# Patient Record
Sex: Female | Born: 1965 | Race: Black or African American | Hispanic: No | Smoking: Never smoker
Health system: Southern US, Community
[De-identification: ages and names within clinical notes are randomized; demographics above are authoritative.]

## PROBLEM LIST (undated history)

## (undated) DIAGNOSIS — E785 Hyperlipidemia, unspecified: Secondary | ICD-10-CM

## (undated) DIAGNOSIS — M549 Dorsalgia, unspecified: Secondary | ICD-10-CM

## (undated) DIAGNOSIS — D649 Anemia, unspecified: Secondary | ICD-10-CM

## (undated) DIAGNOSIS — E669 Obesity, unspecified: Secondary | ICD-10-CM

## (undated) DIAGNOSIS — M1990 Unspecified osteoarthritis, unspecified site: Secondary | ICD-10-CM

## (undated) DIAGNOSIS — M2550 Pain in unspecified joint: Secondary | ICD-10-CM

## (undated) DIAGNOSIS — Z86010 Personal history of colonic polyps: Secondary | ICD-10-CM

## (undated) DIAGNOSIS — K5900 Constipation, unspecified: Secondary | ICD-10-CM

## (undated) DIAGNOSIS — H409 Unspecified glaucoma: Secondary | ICD-10-CM

## (undated) DIAGNOSIS — H269 Unspecified cataract: Secondary | ICD-10-CM

## (undated) DIAGNOSIS — Z91018 Allergy to other foods: Secondary | ICD-10-CM

## (undated) DIAGNOSIS — M7989 Other specified soft tissue disorders: Secondary | ICD-10-CM

## (undated) DIAGNOSIS — E559 Vitamin D deficiency, unspecified: Secondary | ICD-10-CM

## (undated) HISTORY — DX: Personal history of colonic polyps: Z86.010

## (undated) HISTORY — PX: EYE SURGERY: SHX253

## (undated) HISTORY — PX: VARICOSE VEIN SURGERY: SHX832

## (undated) HISTORY — DX: Pain in unspecified joint: M25.50

## (undated) HISTORY — DX: Anemia, unspecified: D64.9

## (undated) HISTORY — DX: Unspecified osteoarthritis, unspecified site: M19.90

## (undated) HISTORY — DX: Constipation, unspecified: K59.00

## (undated) HISTORY — DX: Hyperlipidemia, unspecified: E78.5

## (undated) HISTORY — DX: Allergy to other foods: Z91.018

## (undated) HISTORY — DX: Unspecified cataract: H26.9

## (undated) HISTORY — DX: Unspecified glaucoma: H40.9

## (undated) HISTORY — DX: Other specified soft tissue disorders: M79.89

## (undated) HISTORY — DX: Vitamin D deficiency, unspecified: E55.9

## (undated) HISTORY — DX: Obesity, unspecified: E66.9

## (undated) HISTORY — DX: Dorsalgia, unspecified: M54.9

---

## 1998-12-18 ENCOUNTER — Encounter: Admission: RE | Admit: 1998-12-18 | Discharge: 1999-03-18 | Payer: Self-pay | Admitting: Emergency Medicine

## 2004-01-20 ENCOUNTER — Other Ambulatory Visit: Admission: RE | Admit: 2004-01-20 | Discharge: 2004-01-20 | Payer: Self-pay | Admitting: Family Medicine

## 2005-01-04 ENCOUNTER — Other Ambulatory Visit: Admission: RE | Admit: 2005-01-04 | Discharge: 2005-01-04 | Payer: Self-pay | Admitting: Family Medicine

## 2005-03-04 HISTORY — PX: TUBAL LIGATION: SHX77

## 2006-01-28 ENCOUNTER — Other Ambulatory Visit: Admission: RE | Admit: 2006-01-28 | Discharge: 2006-01-28 | Payer: Self-pay | Admitting: Family Medicine

## 2006-09-12 ENCOUNTER — Encounter (INDEPENDENT_AMBULATORY_CARE_PROVIDER_SITE_OTHER): Payer: Self-pay | Admitting: *Deleted

## 2007-03-05 HISTORY — PX: ABDOMINOPLASTY: SUR9

## 2007-07-22 ENCOUNTER — Other Ambulatory Visit: Admission: RE | Admit: 2007-07-22 | Discharge: 2007-07-22 | Payer: Self-pay | Admitting: Gynecology

## 2007-11-06 ENCOUNTER — Encounter: Admission: RE | Admit: 2007-11-06 | Discharge: 2007-11-06 | Payer: Self-pay | Admitting: Family Medicine

## 2008-02-13 ENCOUNTER — Emergency Department (HOSPITAL_COMMUNITY): Admission: EM | Admit: 2008-02-13 | Discharge: 2008-02-13 | Payer: Self-pay | Admitting: Emergency Medicine

## 2008-03-05 ENCOUNTER — Observation Stay (HOSPITAL_COMMUNITY): Admission: EM | Admit: 2008-03-05 | Discharge: 2008-03-06 | Payer: Self-pay | Admitting: Emergency Medicine

## 2010-02-08 ENCOUNTER — Emergency Department (HOSPITAL_COMMUNITY): Admission: EM | Admit: 2010-02-08 | Discharge: 2010-01-15 | Payer: Self-pay | Admitting: Emergency Medicine

## 2010-06-18 LAB — CROSSMATCH
ABO/RH(D): O POS
Antibody Screen: NEGATIVE

## 2010-06-18 LAB — CBC
Platelets: 323 10*3/uL (ref 150–400)
RDW: 15.4 % (ref 11.5–15.5)
WBC: 8.3 10*3/uL (ref 4.0–10.5)

## 2010-06-18 LAB — DIFFERENTIAL
Basophils Absolute: 0 10*3/uL (ref 0.0–0.1)
Lymphocytes Relative: 13 % (ref 12–46)
Lymphs Abs: 1.1 10*3/uL (ref 0.7–4.0)
Neutro Abs: 6.3 10*3/uL (ref 1.7–7.7)
Neutrophils Relative %: 76 % (ref 43–77)

## 2010-06-18 LAB — URINALYSIS, ROUTINE W REFLEX MICROSCOPIC
Glucose, UA: NEGATIVE mg/dL
Ketones, ur: NEGATIVE mg/dL
Protein, ur: NEGATIVE mg/dL
Urobilinogen, UA: 0.2 mg/dL (ref 0.0–1.0)

## 2010-06-18 LAB — ABO/RH: ABO/RH(D): O POS

## 2010-07-17 NOTE — Op Note (Signed)
NAME:  Brenda Gonzales, Brenda Gonzales                ACCOUNT NO.:  [id number]   MEDICAL RECORD NO.:  [id number]          PATIENT TYPE:  INP   LOCATION:  5125                         FACILITY:  [facility]   PHYSICIAN:  Consuello Bossier., M.D.DATE OF BIRTH:  [birth year]   DATE OF PROCEDURE:  DATE OF DISCHARGE:                               OPERATIVE REPORT   PREOPERATIVE DIAGNOSIS:  Hematoma, following previous abdominoplasty.   POSTOPERATIVE DIAGNOSIS:  Hematoma following previous abdominoplasty.   OPERATION PERFORMED:  Exploration of abdominoplasty wound with placement  of drains following evacuation of hematoma.   SURGEON:  Pleas Patricia, M.D.   ASSISTANT:  Etter Sjogren, M.D.   ANESTHESIA:  General endotracheal.   FINDINGS:  The patient over 4 week ago had an abdominoplasty and had  drains which did not appear to be functioning well, but it had been  removed after approximately 3 weeks.  She had noticed an increased  discomfort and swelling of her abdomen and appeared to have a hematoma  which I feel would be best treated by exploration and the above surgical  procedure was carried out.  At surgery, there was found to be an old  liquefied hematoma and no active bleeding and the hematoma was drained  and some chronic granulation tissue was removed.  No definite active  bleeding site was found and two 10-mm Blake drains were placed in the  area and the wound closed primarily.   PROCEDURE:  The patient was brought to the operating room, given general  endotracheal anesthetic, prepped with Betadine and draped the abdomen in  sterile fashion.  A previous central aspect with transverse lower  abdomen incision was incised and a liquefied hematoma was encountered  and it was seemed to be under pressure, and it was removed with suction.  Suction and digital exploration was then used to explore what appeared  to be the cavity that the hematoma existent and there was no active  bleeding noted  throughout.  It did course more on the left side  superiorly and laterally.  The wound was irrigated with copious amount  of normal saline, again no active bleeding was encountered.  There was  some granulation tissue that was removed.  Wound edges were treated with  electrocautery.  Two 10-mm flat Blake drains were placed in the cavity  and brought up to suprapubic stab wounds.  Following this, the wound was  closed interrupted subcutaneous #3-0 Monocryl followed by running  subcuticular #4-0 and 3-0 Monocryl.  Steri-Strips, Xeroform followed by  abdominal binder placed with slight-to-moderate compression was applied.  The patient tolerated the procedure well, was able to be discharged from  operating room to the recovery room in satisfactory condition.      Consuello Bossier., M.D.    HH/MEDQ  D:  03/05/2008  T:  03/06/2008  Job:  [id number]

## 2010-07-17 NOTE — Discharge Summary (Signed)
NAME:  Brenda Gonzales, Brenda Gonzales                ACCOUNT NO.:  [id number]   MEDICAL RECORD NO.:  [id number]          PATIENT TYPE:  INP   LOCATION:  5125                         FACILITY:  [facility]   PHYSICIAN:  Consuello Bossier., M.D.DATE OF BIRTH:  10/23/1965   DATE OF ADMISSION:  03/05/2008  DATE OF DISCHARGE:  03/06/2008                               DISCHARGE SUMMARY   FINAL DIAGNOSES:  Hematoma following previous abdominoplasty.   OPERATION PERFORMED:  Evacuation of hematoma with placement of drains on  March 05, 2008.   PRESENT ILLNESS:  This 45 year old female was admitted with a history of  having undergone abdominoplasty on February 09, 2008.  Her drains have  not drained a great deal but it appear that until the day prior to  surgery that any degree of swelling she had may be absorbed.  However,  in the morning of admission to the hospital, she developed increasing  discomfort and swelling in the suprapubic area and what appeared to be  possibly an enlarging hematoma and it was felt best to admit the patient  to hospital and in the operating room under anesthesia explore the wound  and drain it.  At surgery, she was found to have an old liquefied  hematoma which was evacuated, irrigated and drains in place.   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   REVIEW OF SYSTEMS:  Normal limits.   PHYSICAL EXAMINATION:  GENERAL:  Some periumbilical bruising and what  appear to be some increase in swelling near the lower abdominal incision  and somewhat left to the midline.  There was also some suprapubic  swelling.   HOSPITAL COURSE:  As noted above.  The patient was taken to the  operating room and the liquefied hematoma was drained and drains placed.  The following morning, she was doing well taking minimal pain  medications and will be discharged  without any pain prescription on  Keflex 250 mg p.o. q.i.d. for 6 days.  2 Blake drains were in place  which she will empty and record as an outpatient and I will see her in 3  days for followup or before if any problems.  She has been quite active  since surgery and will continue that level of activity with main  difference being having drains in place which will be removed as soon as  the drainage is down to relatively minimal amount.      Consuello Bossier., M.D.  Electronically Signed     HH/MEDQ  D:  03/06/2008  T:  03/07/2008  Job:  [id number]

## 2010-07-17 NOTE — H&P (Signed)
NAME:  Brenda Gonzales, Brenda Gonzales                ACCOUNT NO.:  [id number]   MEDICAL RECORD NO.:  [id number]          PATIENT TYPE:  INP   LOCATION:  5125                         FACILITY:  [facility]   PHYSICIAN:  Consuello Bossier., M.D.DATE OF BIRTH:  07/18/1965   DATE OF ADMISSION:  03/05/2008  DATE OF DISCHARGE:                              HISTORY & PHYSICAL   HISTORY OF PRESENT ILLNESS:  This is a 45 year old female, is admitted  for evacuation of a hematoma of her abdomen.  The patient underwent an  abdominoplasty on the February 09, 2008, and the drains did not function  very well.  She has had the drains removed and has noted since the drain  removal, an increasing discomfort of her lower abdomen and it appears  that she has hematoma which I think would be best evacuated and  hemostasis achieved and drains were placed.  She has been apprised of  the operation as well as the reasons behind it.  Of note, the patient is  legally blind.  Dr. Lelon Perla has done an eye operation on her right eye  since her abdominoplasty for some fluid collection and she takes eye  drops for that.   PAST MEDICAL HISTORY:  The patient has lost a great deal of weight, and  that was one of the main reason that she chose to proceed with an  abdominoplasty.  She takes Zocor, but takes no other medications and has  no long term, otherwise medical problems other than her blindness.   REVIEW OF SYSTEMS:  HEENT:  Negative.  PULMONARY:  Negative.  GI:  Negative.  GU:  Negative.   PHYSICAL EXAMINATION:  GENERAL:  The patient is cooperative 45 year old  female in no distress.  She does not appear to have great deal of lack  of conjunctival redness and pulse rate is 84 and regular.  She is  congenitally blind but can see forms apparently.  CHEST:  Clear to auscultation.  HEART:  Normal sinus rhythm, no murmur.  ABDOMEN:  She has previous abdominoplasty incision and a slight amount  of drainage in the central area on two  drain sites inferior to that and  appears in size I last saw her to have an increased accumulation of  fluid particularly in the lower abdomen near the incision and suprapubic  area.  There are some ecchymosis, but not a great deal.  She has quite a  bit of laxity of her arms and legs from her previous significant weight  loss.   IMPRESSION:  Postoperative hematoma from abdominoplasty performed on  February 09, 2008.   DISPOSITION:  As noted above, the patient is to have the wound opened up  and explored with evacuation of the hematoma, hemostasis, and placement  of drains.  She has been apprised of the operation as well as the risk  and, agrees to proceed.      Consuello Bossier., M.D.     HH/MEDQ  D:  03/05/2008  T:  03/06/2008  Job:  [id number]

## 2011-12-07 ENCOUNTER — Ambulatory Visit (INDEPENDENT_AMBULATORY_CARE_PROVIDER_SITE_OTHER): Payer: BC Managed Care – PPO | Admitting: Physician Assistant

## 2011-12-07 VITALS — BP 110/72 | HR 64 | Temp 98.0°F | Resp 16 | Ht 66.18 in | Wt 184.8 lb

## 2011-12-07 DIAGNOSIS — J329 Chronic sinusitis, unspecified: Secondary | ICD-10-CM

## 2011-12-07 DIAGNOSIS — R51 Headache: Secondary | ICD-10-CM

## 2011-12-07 MED ORDER — AMOXICILLIN 875 MG PO TABS: 875.0000 mg | ORAL_TABLET | Freq: Two times a day (BID) | ORAL | Status: DC

## 2011-12-07 MED ORDER — FLUTICASONE PROPIONATE 50 MCG/ACT NA SUSP: 2.0000 | Freq: Every day | NASAL | Status: DC

## 2011-12-07 NOTE — Progress Notes (Signed)
  Subjective:    Patient ID: Brenda Gonzales, female    DOB: 1966/01/25, 46 y.o.   MRN: [id number]  HPI 46 year old female presents with acute onset of sinus pain/pressure, nasal congestion, postnasal drip, headache, and right sided otalgia and pressure.  Denies fever, chills, nausea, vomiting, or abdominal pain.  She has not taken any medications yet due to "not liking to take medicines." otherwise healthy person except for hyperlipidemia.      Review of Systems  Constitutional: Negative for fever and chills.  HENT: Positive for ear pain (right sided), congestion, sneezing, postnasal drip and sinus pressure. Negative for sore throat.   Respiratory: Negative for cough and wheezing.   Gastrointestinal: Negative for nausea, vomiting and abdominal pain.  Neurological: Positive for headaches (frontal).  All other systems reviewed and are negative.       Objective:   Physical Exam  Constitutional: She is oriented to person, place, and time. She appears well-developed and well-nourished.  HENT:  Head: Normocephalic and atraumatic.  Right Ear: Hearing and external ear normal. No drainage or swelling. No mastoid tenderness. Tympanic membrane is erythematous. Tympanic membrane is not perforated. A middle ear effusion is present.  Left Ear: Hearing, tympanic membrane, external ear and ear canal normal.  Mouth/Throat: Uvula is midline, oropharynx is clear and moist and mucous membranes are normal. No oropharyngeal exudate.  Eyes: Conjunctivae normal are normal.  Neck: Normal range of motion.  Cardiovascular: Normal rate, regular rhythm and normal heart sounds.   Pulmonary/Chest: Effort normal and breath sounds normal.  Lymphadenopathy:    She has no cervical adenopathy.  Neurological: She is alert and oriented to person, place, and time.  Psychiatric: She has a normal mood and affect. Her behavior is normal. Judgment and thought content normal.          Assessment & Plan:   1. Sinusitis   amoxicillin (AMOXIL) 875 MG tablet, DISCONTINUED: fluticasone (FLONASE) 50 MCG/ACT nasal spray   Flonase cancelled at pharmacy Start amoxicillin 875 mg bid  Recommend Mucinex OTC to help with congestion Motrin or tylenol prn headache.  Follow up if symptoms worsen or fail to improve.

## 2011-12-07 NOTE — Patient Instructions (Signed)
Mucinex as directed to help with congestion Use Flonase 2 sprays in each nostril daily

## 2014-08-16 ENCOUNTER — Ambulatory Visit (INDEPENDENT_AMBULATORY_CARE_PROVIDER_SITE_OTHER): Payer: BLUE CROSS/BLUE SHIELD | Admitting: Family Medicine

## 2014-08-16 VITALS — BP 122/80 | HR 69 | Temp 98.9°F | Resp 18 | Ht 66.0 in | Wt 209.2 lb

## 2014-08-16 DIAGNOSIS — S39012A Strain of muscle, fascia and tendon of lower back, initial encounter: Secondary | ICD-10-CM

## 2014-08-16 MED ORDER — CYCLOBENZAPRINE HCL 5 MG PO TABS: 5.0000 mg | ORAL_TABLET | Freq: Three times a day (TID) | ORAL | Status: DC | PRN

## 2014-08-16 MED ORDER — MELOXICAM 7.5 MG PO TABS: 7.5000 mg | ORAL_TABLET | Freq: Every day | ORAL | Status: DC

## 2014-08-16 NOTE — Patient Instructions (Signed)
Gently stretch the muscles of the back and continue the cold and hot packs.

## 2014-08-16 NOTE — Progress Notes (Signed)
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 She woke up yesterday morning and bent over to pick up a towel at about 5 AM and felt a sudden spasm in her right low back. She tried to go to work but had come home in tears. Since that time her back as gotten better but she still has enough pain that started her to bend over and move about.  Patient has not had any fever or urinary symptoms. She's had no nausea or vomiting. She has no pain radiating down her leg. Instead, the pain is located in the right posterior superior iliac crest region. She's had no rash.  Objective: Patient appears to be in her usual state of health. 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 She has no rash. She has good strength in her gait is stable.  Assessment: Simple muscle strain which should resolve in the next 48 hours. She's already getting better so I think we'll just keep her out of work for another day and let her go back on Thursday. This chart was scribed in my presence and reviewed by me personally.    ICD-9-CM ICD-10-CM   1. Back strain, initial encounter 847.9 S39.012A cyclobenzaprine (FLEXERIL) 5 MG tablet     meloxicam (MOBIC) 7.5 MG tablet     Signed, Robyn Haber, MD

## 2015-03-18 ENCOUNTER — Ambulatory Visit (INDEPENDENT_AMBULATORY_CARE_PROVIDER_SITE_OTHER): Payer: BLUE CROSS/BLUE SHIELD | Admitting: Family Medicine

## 2015-03-18 VITALS — BP 112/68 | HR 76 | Temp 98.0°F | Resp 18 | Ht 66.0 in | Wt 216.0 lb

## 2015-03-18 DIAGNOSIS — J0100 Acute maxillary sinusitis, unspecified: Secondary | ICD-10-CM

## 2015-03-18 MED ORDER — AMOXICILLIN-POT CLAVULANATE 875-125 MG PO TABS: 1.0000 | ORAL_TABLET | Freq: Two times a day (BID) | ORAL | Status: DC

## 2015-03-18 NOTE — Progress Notes (Deleted)
   Subjective:    Patient ID: Brenda Gonzales, female    DOB: 11-Jun-1965, 50 y.o.   MRN: [id number]  HPI    Review of Systems     Objective:   Physical Exam        Assessment & Plan:

## 2015-03-18 NOTE — Patient Instructions (Signed)

## 2015-03-18 NOTE — Progress Notes (Signed)
This chart was scribed for Brenda Haber, MD by [facility], medical scribe at [facility].The patient was seen in exam room 13 and the patient's care was started at 9:38 AM.  Patient ID: Brenda Gonzales MRN: [id number], DOB: 11-Mar-1965, 50 y.o. Date of Encounter: 03/18/2015  Primary Physician: Vicenta Aly, [place]  Chief Complaint:  Chief Complaint  Patient presents with   Cough    over a mth mth    Nasal Congestion    blood mucous now    Headache   HPI:  Brenda Gonzales is a 50 y.o. female who presents to [facility] due to a cough, congestion for one month. Lately she has been having sinus pressure and headaches. Now producing a bloody nasal discharge. Hx of congential nystagmus, she lives alone and must take a uber to work. Also uses [employer]. Works for [employer] for [employer].  Past Medical History  Diagnosis Date   Hyperlipidemia    Cataract     Home Meds: Prior to Admission medications   Medication Sig Start Date End Date Taking? Authorizing Provider  Brimonidine Tartrate-Timolol (COMBIGAN OP) Apply 1 drop to eye 2 (two) times daily. Right eye only   Yes Historical Provider, MD  Cholecalciferol (VITAMIN D3) 1000 UNITS CAPS Take 1 capsule by mouth daily.   Yes Historical Provider, MD  latanoprost (XALATAN) 0.005 % ophthalmic solution Place 1 drop into the right eye at bedtime.   Yes Historical Provider, MD  simvastatin (ZOCOR) 20 MG tablet Take 20 mg by mouth daily.   Yes Historical Provider, MD  amoxicillin (AMOXIL) 875 MG tablet Take 1 tablet (875 mg total) by mouth 2 (two) times daily. Patient not taking: Reported on 08/16/2014 12/07/11   Collene Leyden, PA-C  atorvastatin (LIPITOR) 20 MG tablet Take 20 mg by mouth daily. Reported on 03/18/2015    Historical Provider, MD  cyclobenzaprine (FLEXERIL) 5 MG tablet Take 1 tablet (5 mg total) by mouth 3 (three) times daily as needed for muscle spasms. Patient not taking: Reported on  03/18/2015 08/16/14   Brenda Haber, MD  meloxicam (MOBIC) 7.5 MG tablet Take 1 tablet (7.5 mg total) by mouth daily. Patient not taking: Reported on 03/18/2015 08/16/14   Brenda Haber, MD   Allergies:  Allergies  Allergen Reactions   Benzoyl Peroxide Swelling   Social History   Social History   Marital Status: Divorced    Spouse Name: N/A   Number of Children: N/A   Years of Education: N/A   Occupational History   Not on file.   Social History Main Topics   Smoking status: Never Smoker    Smokeless tobacco: Not on file   Alcohol Use: Not on file   Drug Use: Not on file   Sexual Activity: Not on file   Other Topics Concern   Not on file   Social History Narrative    Review of Systems: Constitutional: negative for chills, fever, night sweats, weight changes, or fatigue  HEENT: negative for vision changes, hearing loss ST, epistaxis, or sinus pressure. Positive for sinus pressure, congestion, rhinorrhea. Cardiovascular: negative for chest pain or palpitations Respiratory: negative for hemoptysis, wheezing, shortness of breath, or cough Abdominal: negative for abdominal pain, nausea, vomiting, diarrhea, or constipation Dermatological: negative for rash Neurologic: negative for dizziness, or syncope. Positive for headache. All other systems reviewed and are otherwise negative with the exception to those above and in the HPI.  Physical Exam: Blood pressure 112/68, pulse 76,  temperature 98 F (36.7 C), temperature source Oral, resp. rate 18, height 5\' 6"  (1.676 m), weight 216 lb (97.977 kg), last menstrual period 12/01/2011, SpO2 98 %., Body mass index is 34.88 kg/(m^2). General: Well developed, well nourished, in no acute distress. Head: Normocephalic, atraumatic, eyes without discharge, sclera non-icteric. Bilateral auditory canals clear, TM's are without perforation, pearly grey and translucent with reflective cone of light bilaterally. Oral cavity moist,  posterior pharynx without exudate, erythema, peritonsillar abscess, or post nasal drip. Mild tenderness in her maxillary area, mucosal purulent discharge. Congential nystagmus.  Neck: Supple. No thyromegaly. Full ROM. No lymphadenopathy. Lungs: Clear bilaterally to auscultation without wheezes, rales, or rhonchi. Breathing is unlabored. Heart: RRR with S1 S2. No murmurs, rubs, or gallops appreciated. Abdomen: Soft, non-tender, non-distended with normoactive bowel sounds. No hepatomegaly. No rebound/guarding. No obvious abdominal masses. Msk:  Strength and tone normal for age. Extremities/Skin: Warm and dry. No clubbing or cyanosis. No edema. No rashes or suspicious lesions. Neuro: Alert and oriented X 3. Moves all extremities spontaneously. Gait is normal. CNII-XII grossly in tact. Psych:  Responds to questions appropriately with a normal affect.   Labs:  ASSESSMENT AND PLAN:  50 y.o. year old female with   By signing my name below, I, [name], attest that this documentation has been prepared under the direction and in the presence of Brenda Haber, MD.  Electronically Signed: Lora Havens, medical scribe. 03/18/2015 9:48 AM.    This chart was scribed in my presence and reviewed by me personally.    ICD-9-CM ICD-10-CM   1. Acute maxillary sinusitis, recurrence not specified 461.0 J01.00 amoxicillin-clavulanate (AUGMENTIN) 875-125 MG tablet     Signed, Brenda Haber, MD

## 2015-03-24 ENCOUNTER — Telehealth: Payer: Self-pay

## 2015-03-24 DIAGNOSIS — J0100 Acute maxillary sinusitis, unspecified: Secondary | ICD-10-CM

## 2015-03-24 MED ORDER — FLUTICASONE PROPIONATE 50 MCG/ACT NA SUSP: 2.0000 | Freq: Every day | NASAL | Status: DC

## 2015-03-24 NOTE — Telephone Encounter (Signed)
Pt was prescribed antibiotics from previous visit and  she stated her symptoms are still the same.  Please advise  [phone]

## 2015-03-27 NOTE — Telephone Encounter (Signed)
Left message for pt to call back  °

## 2015-05-10 ENCOUNTER — Encounter (HOSPITAL_COMMUNITY): Payer: Self-pay | Admitting: Emergency Medicine

## 2015-05-10 ENCOUNTER — Emergency Department (INDEPENDENT_AMBULATORY_CARE_PROVIDER_SITE_OTHER): Payer: BLUE CROSS/BLUE SHIELD

## 2015-05-10 ENCOUNTER — Emergency Department (HOSPITAL_COMMUNITY)
Admission: EM | Admit: 2015-05-10 | Discharge: 2015-05-10 | Disposition: A | Discharge status: Home / Self Care | Payer: BLUE CROSS/BLUE SHIELD | Source: Home / Self Care | Attending: Emergency Medicine | Admitting: Emergency Medicine

## 2015-05-10 DIAGNOSIS — R0981 Nasal congestion: Secondary | ICD-10-CM

## 2015-05-10 DIAGNOSIS — R05 Cough: Secondary | ICD-10-CM

## 2015-05-10 DIAGNOSIS — R059 Cough, unspecified: Secondary | ICD-10-CM

## 2015-05-10 MED ORDER — OMEPRAZOLE 40 MG PO CPDR: 40.0000 mg | DELAYED_RELEASE_CAPSULE | Freq: Every day | ORAL | Status: DC

## 2015-05-10 MED ORDER — FLUTICASONE PROPIONATE 50 MCG/ACT NA SUSP: 2.0000 | Freq: Every day | NASAL | Status: DC

## 2015-05-10 MED ORDER — HYDROCODONE-HOMATROPINE 5-1.5 MG/5ML PO SYRP
5.0000 mL | ORAL_SOLUTION | Freq: Four times a day (QID) | ORAL | Status: DC | PRN
Start: 2015-05-10 — End: 2017-06-17

## 2015-05-10 MED ORDER — PREDNISONE 10 MG PO TABS: ORAL_TABLET | ORAL | Status: DC

## 2015-05-10 MED ORDER — CETIRIZINE HCL 10 MG PO TABS: 10.0000 mg | ORAL_TABLET | Freq: Every day | ORAL | Status: DC

## 2015-05-10 NOTE — Discharge Instructions (Signed)
I think your symptoms are coming from silent reflux or chronic inflammation of your sinuses. Take omeprazole daily for 2 weeks to treat any reflux. Use cetirizine and Flonase daily to help with the sinus inflammation. Take prednisone as prescribed. Use the Hycodan as needed for cough. This medicine will make you drowsy. Follow-up with your PCP in about 2 weeks for recheck.

## 2015-05-10 NOTE — ED Provider Notes (Signed)
CSN: [id number]     Arrival date & time 05/10/15  1739 History   First MD Initiated Contact with Patient 05/10/15 1902     Chief Complaint  Patient presents with  . Headache  . Cough   (Consider location/radiation/quality/duration/timing/severity/associated sxs/prior Treatment) HPI Brenda Gonzales is a 50 year old woman here for evaluation of cough. Brenda Gonzales states for the last 2 months Brenda Gonzales has had cough, sinus congestion and headaches. Brenda Gonzales was seen at [facility] urgent care and treated for sinusitis with Augmentin. Brenda Gonzales states the antibiotic helped, but her symptoms never completely resolved. Brenda Gonzales states her symptoms will get better, but then worsened again. Brenda Gonzales also reports some hoarseness to her voice in the last 2 weeks. Brenda Gonzales has a difficult time laying flat as Brenda Gonzales states it feels like Brenda Gonzales is drowning.  Brenda Gonzales has tried multiple over-the-counter cough medicines without improvement.  Brenda Gonzales denies any heartburn or reflux symptoms.  Past Medical History  Diagnosis Date  . Hyperlipidemia   . Cataract    Past Surgical History  Procedure Laterality Date  . Eye surgery     Family History  Problem Relation Age of Onset  . Hypertension Mother   . Hyperlipidemia Mother   . Diabetes Father   . Hypertension Father   . Cancer Father    Social History  Substance Use Topics  . Smoking status: Never Smoker   . Smokeless tobacco: None  . Alcohol Use: None   OB History    No data available     Review of Systems As in history of present illness Allergies  Benzoyl peroxide  Home Medications   Prior to Admission medications   Medication Sig Start Date End Date Taking? Authorizing Provider  Brimonidine Tartrate-Timolol (COMBIGAN OP) Apply 1 drop to eye 2 (two) times daily. Right eye only   Yes Historical Provider, MD  latanoprost (XALATAN) 0.005 % ophthalmic solution Place 1 drop into the right eye at bedtime.   Yes Historical Provider, MD  simvastatin (ZOCOR) 20 MG tablet Take 20 mg by mouth daily.   Yes  Historical Provider, MD  atorvastatin (LIPITOR) 20 MG tablet Take 20 mg by mouth daily. Reported on 03/18/2015    Historical Provider, MD  cetirizine (ZYRTEC) 10 MG tablet Take 1 tablet (10 mg total) by mouth daily. 05/10/15   Melony Overly, MD  Cholecalciferol (VITAMIN D3) 1000 UNITS CAPS Take 1 capsule by mouth daily.    Historical Provider, MD  fluticasone (FLONASE) 50 MCG/ACT nasal spray Place 2 sprays into both nostrils daily. 05/10/15   Melony Overly, MD  HYDROcodone-homatropine (HYCODAN) 5-1.5 MG/5ML syrup Take 5 mLs by mouth every 6 (six) hours as needed for cough. 05/10/15   Melony Overly, MD  omeprazole (PRILOSEC) 40 MG capsule Take 1 capsule (40 mg total) by mouth daily. 05/10/15   Melony Overly, MD  predniSONE (DELTASONE) 10 MG tablet Take 6 tablets on day 1, 5 on day 2, 4 on day 3, 3 on day 4, 2 on day 5, and 1 on day 6 05/10/15   Melony Overly, MD   Meds Ordered and Administered this Visit  Medications - No data to display  BP 152/74 mmHg  Pulse 63  Temp(Src) 98.2 F (36.8 C) (Oral)  Resp 16  SpO2 98%  LMP 12/01/2011 No data found.   Physical Exam  Constitutional: Brenda Gonzales is oriented to person, place, and time. Brenda Gonzales appears well-developed and well-nourished. No distress.  HENT:  Mouth/Throat: Oropharynx is clear and moist. No oropharyngeal exudate.  Small amount of nasal discharge present. Nasal mucosa is erythematous.  Neck: Neck supple.  Cardiovascular: Normal rate, regular rhythm and normal heart sounds.   No murmur heard. Pulmonary/Chest: Effort normal and breath sounds normal. No respiratory distress. Brenda Gonzales has no wheezes. Brenda Gonzales has no rales.  Lymphadenopathy:    Brenda Gonzales has no cervical adenopathy.  Neurological: Brenda Gonzales is alert and oriented to person, place, and time.    ED Course  Procedures (including critical care time)  Labs Review Labs Reviewed - No data to display  Imaging Review Dg Chest 2 View  05/10/2015  CLINICAL DATA:  Cough EXAM: CHEST  2 VIEW COMPARISON:  None. FINDINGS:  The heart size and mediastinal contours are within normal limits. Both lungs are clear. The visualized skeletal structures are unremarkable. IMPRESSION: No active cardiopulmonary disease. Electronically Signed   By: Franchot Gallo M.D.   On: 05/10/2015 19:39      MDM   1. Cough   2. Sinus congestion    X-ray negative. I suspect her symptoms are coming from silent reflux versus chronic sinus inflammation. Treat with omeprazole, Flonase, cetirizine, and prednisone taper. Hycodan as needed for cough. Follow-up with PCP in 2 weeks for recheck.    Melony Overly, MD 05/10/15 [phone]

## 2015-05-10 NOTE — ED Notes (Signed)
The patient presented to the [facility] with a complaint of chest congestion, a headache and cough off and on since December. The patient stated that she was evaluated at the [facility] and prescribed an antibiotic but the symptoms never resolved.

## 2016-01-19 DIAGNOSIS — S6992XD Unspecified injury of left wrist, hand and finger(s), subsequent encounter: Secondary | ICD-10-CM | POA: Diagnosis not present

## 2016-03-18 DIAGNOSIS — N912 Amenorrhea, unspecified: Secondary | ICD-10-CM | POA: Diagnosis not present

## 2016-03-18 DIAGNOSIS — Z1231 Encounter for screening mammogram for malignant neoplasm of breast: Secondary | ICD-10-CM | POA: Diagnosis not present

## 2016-03-18 DIAGNOSIS — Z6835 Body mass index (BMI) 35.0-35.9, adult: Secondary | ICD-10-CM | POA: Diagnosis not present

## 2016-03-18 DIAGNOSIS — Z01419 Encounter for gynecological examination (general) (routine) without abnormal findings: Secondary | ICD-10-CM | POA: Diagnosis not present

## 2016-04-29 DIAGNOSIS — H401131 Primary open-angle glaucoma, bilateral, mild stage: Secondary | ICD-10-CM | POA: Diagnosis not present

## 2016-07-04 DIAGNOSIS — Z6834 Body mass index (BMI) 34.0-34.9, adult: Secondary | ICD-10-CM | POA: Diagnosis not present

## 2016-07-04 DIAGNOSIS — S61259A Open bite of unspecified finger without damage to nail, initial encounter: Secondary | ICD-10-CM | POA: Diagnosis not present

## 2016-08-19 DIAGNOSIS — H5501 Congenital nystagmus: Secondary | ICD-10-CM | POA: Diagnosis not present

## 2016-08-19 DIAGNOSIS — H5203 Hypermetropia, bilateral: Secondary | ICD-10-CM | POA: Diagnosis not present

## 2016-08-19 DIAGNOSIS — H2512 Age-related nuclear cataract, left eye: Secondary | ICD-10-CM | POA: Diagnosis not present

## 2016-08-19 DIAGNOSIS — H401111 Primary open-angle glaucoma, right eye, mild stage: Secondary | ICD-10-CM | POA: Diagnosis not present

## 2016-08-19 DIAGNOSIS — H25012 Cortical age-related cataract, left eye: Secondary | ICD-10-CM | POA: Diagnosis not present

## 2016-08-27 ENCOUNTER — Ambulatory Visit: Payer: BLUE CROSS/BLUE SHIELD | Attending: Ophthalmology | Admitting: Occupational Therapy

## 2016-08-27 DIAGNOSIS — R41842 Visuospatial deficit: Secondary | ICD-10-CM | POA: Diagnosis not present

## 2016-08-29 NOTE — Therapy (Signed)
[name] [address] [name], [place], [address] Phone: [phone]   Fax:  [phone]  Occupational Therapy Treatment  Patient Details  Name: Brenda Gonzales MRN: [id number] Date of Birth: 20-Oct-1965 Referring Provider: Dr. Gershon Crane  Encounter Date: 08/27/2016      OT End of Session - 08/29/16 0812    Visit Number 1   Number of Visits 1   Authorization Type BCBS   OT Start Time 1025   OT Stop Time 1130   OT Time Calculation (min) 65 min   Activity Tolerance Patient tolerated treatment well   Behavior During Therapy [name] for tasks assessed/performed      Past Medical History:  Diagnosis Date  . Cataract   . Hyperlipidemia     Past Surgical History:  Procedure Laterality Date  . EYE SURGERY      There were no vitals filed for this visit.      Subjective Assessment - 08/29/16 1243    Subjective  Pt with glaucoma, nystagmus congenital cataract left eye, aphakic right eye presents with visual impairments which impede ADLs/IADLS   Pertinent History see above   Patient Stated Goals Correct magnification   Currently in Pain? No/denies            [facility] OT Assessment - 08/29/16 0001      Assessment   Diagnosis glaucoma, nystagmus, congenital cataract left eye, aphakic right eye   Referring Provider Dr. 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     ADL   ADL comments modified independent with all basic ADLS     IADL   Meal Prep Plans, prepares and serves adequate meals  independently     Mobility   Mobility Status Independent     Vision - History   Baseline Vision Legally blind   Visual History Cataracts   Patient Visual Report Peripheral vision impairment  limited visual field      Patriot tested   Per MD/OD Report OD20/400, OS counts fingers   Reading Acuity 20/250   Ocular Range of Motion Other (comment)  Pt uses eyes independently of one another   Alignment/Gaze Preference --  impaired unable to maintain convergence, has nystagmus   Visual Fields --  impaired visual fields   Patient has diffculty with activities due to visual impairment Reading bills  reading print on boxes in [place] Using clock, pt is unable to see items on left side when looking at center of clock. Pt has a Investment banker, corporate, CCTV and a 12x telescope. Pt reports using magnification software on computer. Pt uses right eye for reading, and left eye for distance(using telescope)     Cognition   Overall Cognitive Status Within Functional Limits for tasks assessed           Pt already has electronic devices to assist with reading . Pt's primary focus is telescopes for distance reading at work.. Therapist only had the  7x telescope for pt to trial, therefore therapist recommended pt consider a low vision optometrist.Pt was provided with Dr. Vonna Kotyk contact info.               OT Education - 08/29/16 0825    Education provided Yes   Education Details use of a 7 x 18 telescope, compensatory strategies for visual deficits when reading, contact information for Dr. 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 Pt can benefit from skilled occupational therapy in order to maximize safety and independence.   Occupational Profile and client history currently impacting functional performance Pt works at [employer] for [employer], however she reports difficulties due to her visual deficits.   Occupational performance deficits (Please refer to evaluation for details): ADL's;IADL's;Leisure;Work   Designer, multimedia   Current Impairments/barriers affecting progress: severity of visual impairment   OT Frequency One time visit   OT Duration 8 weeks   OT Treatment/Interventions Self-care/ADL training;DME and/or AE instruction;Patient/family education;Visual/perceptual remediation/compensation   Plan Pt was seen for evaluation and treatment on day of evaluation. Pt already has a Manufacturing engineer video magnifier, a CCTV and a telescope. Pt is primarily interested in a new telescope for work activities.  Pt. was provided with contact info for a low vision optometrist (Dr. Orlin Hilding [facility] for futher assistance with telescopes. Pt was seen for a 1x visit therefore no goals were set.   Clinical Decision Making Limited treatment options, no task modification necessary   Recommended Other Services provided with info for  low vision optometrist Dr Georgiann Hahn   Consulted and Agree with Plan of Care Patient;Other (Comment)  co-worker who reports she is and O&M specialist      Patient will benefit from skilled therapeutic intervention in order to improve the following deficits and impairments:  Impaired vision/preception, Decreased knowledge of use of DME  Visit Diagnosis: Visuospatial deficit    Problem List There are no active problems to display for this patient.   Brenda Gonzales 08/29/2016, 12:44 PM Theone Murdoch, OTR/L [phone] Phone: [phone] 12:48 PM 08/29/16 [facility] [address] [place], [place], [address] Phone: [phone]   Fax:  [phone]  Name: Brenda Gonzales MRN: [id number] Date of Birth: Jan 23, 1966

## 2016-08-29 NOTE — Patient Instructions (Signed)
You were able to see the calendar with 1 inch letters at 2ft away using a 7x 18   9.3 degree telescope by [employer]

## 2016-08-31 DIAGNOSIS — Z6833 Body mass index (BMI) 33.0-33.9, adult: Secondary | ICD-10-CM | POA: Diagnosis not present

## 2016-08-31 DIAGNOSIS — R42 Dizziness and giddiness: Secondary | ICD-10-CM | POA: Diagnosis not present

## 2016-08-31 DIAGNOSIS — L209 Atopic dermatitis, unspecified: Secondary | ICD-10-CM | POA: Diagnosis not present

## 2016-08-31 DIAGNOSIS — H509 Unspecified strabismus: Secondary | ICD-10-CM | POA: Diagnosis not present

## 2016-09-06 ENCOUNTER — Other Ambulatory Visit: Payer: Self-pay | Admitting: Nurse Practitioner

## 2016-09-06 DIAGNOSIS — R42 Dizziness and giddiness: Secondary | ICD-10-CM

## 2016-09-18 ENCOUNTER — Ambulatory Visit
Admission: RE | Admit: 2016-09-18 | Discharge: 2016-09-18 | Disposition: A | Discharge status: Home / Self Care | Payer: BLUE CROSS/BLUE SHIELD | Source: Ambulatory Visit | Attending: Nurse Practitioner | Admitting: Nurse Practitioner

## 2016-09-18 DIAGNOSIS — R42 Dizziness and giddiness: Secondary | ICD-10-CM

## 2016-09-18 MED ORDER — GADOBENATE DIMEGLUMINE 529 MG/ML IV SOLN
20.0000 mL | Freq: Once | INTRAVENOUS | Status: AC | PRN
  Administered 2016-09-18: 20 mL via INTRAVENOUS

## 2016-09-21 DIAGNOSIS — Z13220 Encounter for screening for lipoid disorders: Secondary | ICD-10-CM | POA: Diagnosis not present

## 2016-09-21 DIAGNOSIS — Z6834 Body mass index (BMI) 34.0-34.9, adult: Secondary | ICD-10-CM | POA: Diagnosis not present

## 2016-09-21 DIAGNOSIS — D72819 Decreased white blood cell count, unspecified: Secondary | ICD-10-CM | POA: Diagnosis not present

## 2016-10-04 ENCOUNTER — Telehealth: Payer: Self-pay | Admitting: Oncology

## 2016-10-04 ENCOUNTER — Encounter: Payer: Self-pay | Admitting: Oncology

## 2016-10-04 NOTE — Telephone Encounter (Signed)
Appt has been scheduled for the pt to see Dr. Alen Blew on 7/23 at 11am. Pt aware to arrive 30 minutes early. Address and insurance verified. letter mailed to the pt and faxed to the referring.

## 2016-10-24 ENCOUNTER — Ambulatory Visit (HOSPITAL_BASED_OUTPATIENT_CLINIC_OR_DEPARTMENT_OTHER): Payer: BLUE CROSS/BLUE SHIELD | Admitting: Oncology

## 2016-10-24 ENCOUNTER — Encounter (INDEPENDENT_AMBULATORY_CARE_PROVIDER_SITE_OTHER): Payer: Self-pay

## 2016-10-24 VITALS — BP 137/79 | HR 58 | Temp 97.7°F | Resp 18 | Ht 66.0 in | Wt 220.1 lb

## 2016-10-24 DIAGNOSIS — D72819 Decreased white blood cell count, unspecified: Secondary | ICD-10-CM | POA: Diagnosis not present

## 2016-10-24 DIAGNOSIS — D709 Neutropenia, unspecified: Secondary | ICD-10-CM

## 2016-10-24 NOTE — Progress Notes (Signed)
Reason for Referral: Leukocytopenia.   HPI: 51 year old African-American woman currently of [place] where she lived the majority of her life. She has a history of congenital cataracts and glaucoma and legally blind. She is really no other comorbid conditions and continues to live independently and continues to work full time. She established care with a new primary care providers a CBC at that time showed a white cell count of 3.1 with the normal range of 3.8. Her differential was normal. Her hemoglobin was 4.7 and a platelet count of 248. The absolute neutrophil count was 1327 but the neutrophil percentage was 42.8. She is completely asymptomatic from these findings. She denied any fevers or chills or sweats. She denied any lymphadenopathy. She does report some hot flashes related to menopause.  She does not report any headaches, blurry vision, syncope or seizures. She did have one episode of dizziness that has resolved. She does not report any chest pain, palpitation, orthopnea or leg edema. She does not report any cough, wheezing or hemoptysis. She does not report any nausea, vomiting or abdominal pain. She does not report any frequency urgency or hesitancy. May review of systems unremarkable.   Past Medical History:  Diagnosis Date  . Cataract   . Hyperlipidemia   :  Past Surgical History:  Procedure Laterality Date  . EYE SURGERY    :   Current Outpatient Prescriptions:  .  atorvastatin (LIPITOR) 20 MG tablet, Take 20 mg by mouth daily. Reported on 03/18/2015, Disp: , Rfl:  .  Brimonidine Tartrate-Timolol (COMBIGAN OP), Apply 1 drop to eye 2 (two) times daily. Right eye only, Disp: , Rfl:  .  cetirizine (ZYRTEC) 10 MG tablet, Take 1 tablet (10 mg total) by mouth daily. (Patient not taking: Reported on 08/27/2016), Disp: 30 tablet, Rfl: 0 .  Cholecalciferol (VITAMIN D3) 1000 UNITS CAPS, Take 1 capsule by mouth daily., Disp: , Rfl:  .  fluticasone (FLONASE) 50 MCG/ACT nasal spray, Place  2 sprays into both nostrils daily. (Patient not taking: Reported on 08/27/2016), Disp: 16 g, Rfl: 0 .  HYDROcodone-homatropine (HYCODAN) 5-1.5 MG/5ML syrup, Take 5 mLs by mouth every 6 (six) hours as needed for cough. (Patient not taking: Reported on 08/27/2016), Disp: 120 mL, Rfl: 0 .  latanoprost (XALATAN) 0.005 % ophthalmic solution, Place 1 drop into the right eye at bedtime., Disp: , Rfl:  .  omeprazole (PRILOSEC) 40 MG capsule, Take 1 capsule (40 mg total) by mouth daily. (Patient not taking: Reported on 08/27/2016), Disp: 14 capsule, Rfl: 0 .  predniSONE (DELTASONE) 10 MG tablet, Take 6 tablets on day 1, 5 on day 2, 4 on day 3, 3 on day 4, 2 on day 5, and 1 on day 6 (Patient not taking: Reported on 08/27/2016), Disp: 21 tablet, Rfl: 0 .  simvastatin (ZOCOR) 20 MG tablet, Take 20 mg by mouth daily., Disp: , Rfl: :  Allergies  Allergen Reactions  . Benzoyl Peroxide Swelling  :  Family History  Problem Relation Age of Onset  . Hypertension Mother   . Hyperlipidemia Mother   . Diabetes Father   . Hypertension Father   . Cancer Father   :  Social History   Social History  . Marital status: Divorced    Spouse name: N/A  . Number of children: N/A  . Years of education: N/A   Occupational History  . Not on file.   Social History Main Topics  . Smoking status: Never Smoker  . Smokeless tobacco: Not on file  .  Alcohol use Not on file  . Drug use: Unknown  . Sexual activity: Not on file   Other Topics Concern  . Not on file   Social History Narrative  . No narrative on file  :  Pertinent items are noted in HPI.  Exam: Blood pressure 137/79, pulse (!) 58, temperature 97.7 F (36.5 C), temperature source Oral, resp. rate 18, height '5\' 6"'  (1.676 m), weight 220 lb 1.6 oz (99.8 kg), last menstrual period 12/01/2011, SpO2 100 %.  ECOG 0.  General appearance: alert and cooperative appeared without distress. Throat: No oral ulcers or lesions. Neck: no adenopathy Resp: clear to  auscultation bilaterally without wheezes or dullness to percussion. Cardio: regular rate and rhythm, S1, S2 normal, no murmur, click, rub or gallop GI: soft, non-tender; bowel sounds normal; no masses,  no organomegaly Extremities: extremities normal, atraumatic, no cyanosis or edema Neurological examination: No deficits. Skin: Showed no rashes or lesions. No ecchymosis or petechiae.   CBC    Component Value Date/Time   WBC 8.3 03/05/2008 1506   RBC 3.59 (L) 03/05/2008 1506   HGB 10.8 (L) 03/05/2008 1506   HCT 32.5 (L) 03/05/2008 1506   PLT 323 03/05/2008 1506   MCV 90.4 03/05/2008 1506   MCHC 33.3 03/05/2008 1506   RDW 15.4 03/05/2008 1506   LYMPHSABS 1.1 03/05/2008 1506   MONOABS 0.7 03/05/2008 1506   EOSABS 0.2 03/05/2008 1506   BASOSABS 0.0 03/05/2008 1506     Assessment and Plan:   51 year old woman with the following issues:  1. Leukocytopenia was a normal differential detected on an routine CBC done on 09/21/2016. Her white cell count was 3.1 with a normal of 3.8. She has a normal differential with a normal hemoglobin and platelet count. She had a normal CBC back in January 2010.  The differential diagnosis was discussed today with the patient. This represents likely a benign ethnic radiation rather than a true abnormality. Other etiologies include autoimmune causes, myelodysplasia, lymphoproliferative disorder, other hematological condition I considered extremely unlikely. I see no reason to investigate this further and she does not require any further testing such as bone marrow biopsy or imaging studies.  I recommended observation and surveillance with a repeat his CBC in a year. If she has other abnormalities such as worsening anemia or thrombocytopenia or worsening leukocytopenia, then further evaluation will be needed. It is very likely that she will have fluctuating white cell count slightly below the normal range to a normal range.  2. Follow-up: I'm happy to see  her in the future as needed.

## 2017-05-02 LAB — HM MAMMOGRAPHY: HM Mammogram: NORMAL (ref 0–4)

## 2017-05-21 DIAGNOSIS — Z01419 Encounter for gynecological examination (general) (routine) without abnormal findings: Secondary | ICD-10-CM | POA: Diagnosis not present

## 2017-05-21 DIAGNOSIS — Z1231 Encounter for screening mammogram for malignant neoplasm of breast: Secondary | ICD-10-CM | POA: Diagnosis not present

## 2017-05-21 DIAGNOSIS — H401111 Primary open-angle glaucoma, right eye, mild stage: Secondary | ICD-10-CM | POA: Diagnosis not present

## 2017-05-21 DIAGNOSIS — H5501 Congenital nystagmus: Secondary | ICD-10-CM | POA: Diagnosis not present

## 2017-05-21 DIAGNOSIS — H2512 Age-related nuclear cataract, left eye: Secondary | ICD-10-CM | POA: Diagnosis not present

## 2017-05-21 DIAGNOSIS — Z6836 Body mass index (BMI) 36.0-36.9, adult: Secondary | ICD-10-CM | POA: Diagnosis not present

## 2017-05-21 DIAGNOSIS — H25012 Cortical age-related cataract, left eye: Secondary | ICD-10-CM | POA: Diagnosis not present

## 2017-06-25 ENCOUNTER — Ambulatory Visit: Payer: BLUE CROSS/BLUE SHIELD | Admitting: Internal Medicine

## 2017-06-25 ENCOUNTER — Encounter: Payer: Self-pay | Admitting: Internal Medicine

## 2017-06-25 VITALS — BP 128/76 | HR 81 | Temp 98.0°F | Resp 10 | Ht 66.0 in | Wt 229.0 lb

## 2017-06-25 DIAGNOSIS — E669 Obesity, unspecified: Secondary | ICD-10-CM | POA: Insufficient documentation

## 2017-06-25 DIAGNOSIS — E782 Mixed hyperlipidemia: Secondary | ICD-10-CM | POA: Insufficient documentation

## 2017-06-25 DIAGNOSIS — Z6832 Body mass index (BMI) 32.0-32.9, adult: Secondary | ICD-10-CM | POA: Insufficient documentation

## 2017-06-25 DIAGNOSIS — Z833 Family history of diabetes mellitus: Secondary | ICD-10-CM | POA: Diagnosis not present

## 2017-06-25 DIAGNOSIS — Z79890 Hormone replacement therapy (postmenopausal): Secondary | ICD-10-CM | POA: Insufficient documentation

## 2017-06-25 DIAGNOSIS — Z1211 Encounter for screening for malignant neoplasm of colon: Secondary | ICD-10-CM | POA: Diagnosis not present

## 2017-06-25 NOTE — Patient Instructions (Signed)
Continue current medications as ordered  follow up with GYN as scheduled  Will call with GI referral for colonoscopy  RETURN TO OFFICE FOR FASTING LABS   Follow up in 6 mos for CPE/ECG

## 2017-06-25 NOTE — Progress Notes (Signed)
Patient ID: Brenda Gonzales, female   DOB: Oct 03, 1965, 52 y.o.   MRN: [id number]   Location:  [facility] OFFICE  Provider: DR Arletha Grippe  Code Status:  Goals of Care:  Advanced Directives 08/29/2016  Does Patient Have a Medical Advance Directive? No     Chief Complaint  Patient presents with  . Establish Care    New Patient Establish Care. Discuss cholesterol   . Referral    GI referral for Colonoscopy- order pending     HPI: Patient is a 52 y.o. female seen today as a new pt. Previous PCP [facility].  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 She uses eye gtts. She is legally blind.   Hyperlipidemia - she has taken cholesterol medication for many yrs but it was stopped about 1 yr ago.  She states HDL has been high but does not recall LDL and total cholesterol. Both parents with HTN and father has DM. No FHx CAD.   She has a mirena - plan to remove in July. She uses it for menopausal sx's but it is ineffective. She has tried oral  HRT in the past that was also ineffective. Followed by [facility]   Past Medical History:  Diagnosis Date  . Cataract   . Glaucoma   . Hyperlipidemia     Past Surgical History:  Procedure Laterality Date  . ABDOMINOPLASTY  2009  . EYE SURGERY    . TUBAL LIGATION  2007     reports that she has never smoked. She has never used smokeless tobacco. She reports that she drinks alcohol. She reports that she does not use drugs. Social History   Socioeconomic History  . Marital status: Divorced    Spouse name: Not on file  . Number of children: Not on file  . Years of education: Not on file  . Highest education level: Not on file  Occupational History  . Not on file  Social Needs  . Financial resource strain: Not on file  . Food insecurity:    Worry: Not on file    Inability: Not on file  . Transportation needs:    Medical: Not on file    Non-medical: Not on file  Tobacco Use  .  Smoking status: Never Smoker  . Smokeless tobacco: Never Used  Substance and Sexual Activity  . Alcohol use: Yes    Comment: less than one  . Drug use: Never  . Sexual activity: Not on file  Lifestyle  . Physical activity:    Days per week: Not on file    Minutes per session: Not on file  . Stress: Not on file  Relationships  . Social connections:    Talks on phone: Not on file    Gets together: Not on file    Attends religious service: Not on file    Active member of club or organization: Not on file    Attends meetings of clubs or organizations: Not on file    Relationship status: Not on file  . Intimate partner violence:    Fear of current or ex partner: Not on file    Emotionally abused: Not on file    Physically abused: Not on file    Forced sexual activity: Not on file  Other Topics Concern  . Not on file  Social History Narrative   Social History      Diet?  Do you drink/eat things with caffeine? yes      Marital status?          divorced                          What year were you married? 1991      Do you live in a house, apartment, assisted living, condo, trailer, etc.? condo      Is it one or more stories? Two stories      How many persons live in your home? One       Do you have any pets in your home? (please list) one dog      Highest level of education completed? 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               yes                       Type & how often? Cardio twice per week      Advanced Directives      Do you have a living will? no      Do you have a DNR form?                                  If not, do you want to discuss one? no      Do you have signed POA/HPOA for forms? no      Functional Status      Do you have difficulty bathing or dressing yourself? no      Do you have difficulty preparing food or eating? no      Do you have difficulty managing your medications? no       Do you have difficulty managing your finances? no      Do you have difficulty affording your medications?          Family History  Problem Relation Age of Onset  . Hypertension Mother   . Hyperlipidemia Mother   . Diabetes Father 69  . Hypertension Father   . Cancer Father   . Prostate cancer Father 47    Allergies  Allergen Reactions  . Benzoyl Peroxide Swelling    Outpatient Encounter Medications as of 06/25/2017  Medication Sig  . brimonidine (ALPHAGAN) 0.15 % ophthalmic solution 1 drop in right eye twice daily  . latanoprost (XALATAN) 0.005 % ophthalmic solution Place 1 drop into the right eye at bedtime.  . Multiple Vitamins-Minerals (MULTIVITAMIN WOMEN PO) Take by mouth.  . [DISCONTINUED] Brimonidine Tartrate-Timolol (COMBIGAN OP) Apply 1 drop to eye 2 (two) times daily. Right eye only   No facility-administered encounter medications on file as of 06/25/2017.     Review of Systems:  Review of Systems  Constitutional: Positive for diaphoresis and unexpected weight change (gain).  Endocrine:       Hot flashes  Musculoskeletal:       Joint stiffness  All other systems reviewed and are negative.   Health Maintenance  Topic Date Due  . HIV Screening  12/13/1979  . PAP SMEAR  12/12/1985  . MAMMOGRAM  12/13/2014  . COLONOSCOPY  12/13/2014  . INFLUENZA VACCINE  10/02/2017  . TETANUS/TDAP  10/20/2025    Physical Exam: Vitals:   06/25/17 1045  BP: 128/76  Pulse: 81  Resp: 10  Temp: 98 F (36.7 C)  TempSrc: Oral  SpO2: 96%  Weight: 229 lb (103.9 kg)  Height: '5\' 6"'  (1.676 m)   Body mass index is 36.96 kg/m. Physical Exam  Constitutional: She is oriented to person, place, and time. She appears well-developed and well-nourished.  HENT:  Mouth/Throat: Oropharynx is clear and moist. No oropharyngeal exudate.  Eyes: Pupils are equal, round, and reactive to light. No scleral icterus.  Neck: Neck supple. Carotid bruit is not present. No tracheal deviation  present. No thyromegaly present.  Cardiovascular: Normal rate, regular rhythm, normal heart sounds and intact distal pulses. Exam reveals no gallop and no friction rub.  No murmur heard. Trace R>LLE edema. No calf TTP  Pulmonary/Chest: Effort normal and breath sounds normal. No stridor. No respiratory distress. She has no wheezes. She has no rales.  Abdominal: Soft. Bowel sounds are normal. She exhibits no distension and no mass. There is no hepatomegaly. There is no tenderness. There is no rebound and no guarding.  Musculoskeletal: She exhibits edema.  Lymphadenopathy:    She has no cervical adenopathy.  Neurological: She is alert and oriented to person, place, and time. She has normal reflexes.  Skin: Skin is warm and dry. No rash noted.  Psychiatric: She has a normal mood and affect. Her behavior is normal. Judgment and thought content normal.    Labs reviewed: Basic Metabolic Panel: No results for input(s): NA, K, CL, CO2, GLUCOSE, BUN, CREATININE, CALCIUM, MG, PHOS, TSH in the last 8760 hours. Liver Function Tests: No results for input(s): AST, ALT, ALKPHOS, BILITOT, PROT, ALBUMIN in the last 8760 hours. No results for input(s): LIPASE, AMYLASE in the last 8760 hours. No results for input(s): AMMONIA in the last 8760 hours. CBC: No results for input(s): WBC, NEUTROABS, HGB, HCT, MCV, PLT in the last 8760 hours. Lipid Panel: No results for input(s): CHOL, HDL, LDLCALC, TRIG, CHOLHDL, LDLDIRECT in the last 8760 hours. No results found for: HGBA1C  Procedures since last visit: No results found.  Assessment/Plan   ICD-10-CM   1. Mixed hyperlipidemia E78.2 Lipid Panel    TSH  2. Obesity (BMI 35.0-39.9 without comorbidity) E66.9 CBC with Differential/Platelets    CMP with eGFR([facility])    Hemoglobin A1c  3. Hormone replacement therapy (HRT) Z79.890 CMP with eGFR([facility])   has mirena  4. Family history of diabetes mellitus Z83.3 CMP with eGFR([facility])    Hemoglobin A1c  5. Colon  cancer screening Z12.11 Ambulatory referral to Gastroenterology   Get old records   Continue current medications as ordered  follow up with GYN as scheduled  Will call with GI referral for colonoscopy  RETURN TO OFFICE FOR FASTING LABS   Follow up in 6 mos for CPE/ECG   Ashawna Hanback S. Perlie Gold  [facility] [address] [place], [place] [address] [phone] Cell (Monday-Friday 8 AM - 5 PM) [phone] After 5 PM and follow prompts

## 2017-08-28 ENCOUNTER — Other Ambulatory Visit: Payer: Self-pay

## 2017-08-28 ENCOUNTER — Ambulatory Visit (AMBULATORY_SURGERY_CENTER): Payer: Self-pay

## 2017-08-28 VITALS — Ht 66.5 in | Wt 223.6 lb

## 2017-08-28 DIAGNOSIS — Z1211 Encounter for screening for malignant neoplasm of colon: Secondary | ICD-10-CM

## 2017-08-28 NOTE — Progress Notes (Signed)
Denies allergies to eggs or soy products. Denies complication of anesthesia or sedation. Denies use of weight loss medication. Denies use of O2.   Emmi instructions declined.  

## 2017-09-11 ENCOUNTER — Encounter: Payer: Self-pay | Admitting: Internal Medicine

## 2017-09-11 ENCOUNTER — Ambulatory Visit (AMBULATORY_SURGERY_CENTER): Payer: BLUE CROSS/BLUE SHIELD | Admitting: Internal Medicine

## 2017-09-11 VITALS — BP 130/76 | HR 71 | Temp 98.6°F | Resp 22 | Ht 66.0 in | Wt 229.0 lb

## 2017-09-11 DIAGNOSIS — Z1211 Encounter for screening for malignant neoplasm of colon: Secondary | ICD-10-CM

## 2017-09-11 DIAGNOSIS — D120 Benign neoplasm of cecum: Secondary | ICD-10-CM | POA: Diagnosis not present

## 2017-09-11 MED ORDER — SODIUM CHLORIDE 0.9 % IV SOLN: 500.0000 mL | Freq: Once | INTRAVENOUS | Status: DC

## 2017-09-11 NOTE — Op Note (Signed)
[place] Patient Name: Brenda Gonzales Procedure Date: 09/11/2017 9:57 AM MRN: [id number] Endoscopist: Gatha Mayer , MD Age: 52 Referring MD:  Date of Birth: 01-18-1966 Gender: Female Account #: [id number] Procedure:                Colonoscopy Indications:              Screening for colorectal malignant neoplasm, This                            is the patient's first colonoscopy Medicines:                Propofol per Anesthesia, Monitored Anesthesia Care Procedure:                Pre-Anesthesia Assessment:                           - Prior to the procedure, a History and Physical                            was performed, and patient medications and                            allergies were reviewed. The patient's tolerance of                            previous anesthesia was also reviewed. The risks                            and benefits of the procedure and the sedation                            options and risks were discussed with the patient.                            All questions were answered, and informed consent                            was obtained. Prior Anticoagulants: The patient has                            taken no previous anticoagulant or antiplatelet                            agents. ASA Grade Assessment: II - A patient with                            mild systemic disease. After reviewing the risks                            and benefits, the patient was deemed in                            satisfactory condition to undergo the procedure.  After obtaining informed consent, the colonoscope                            was passed under direct vision. Throughout the                            procedure, the patient's blood pressure, pulse, and                            oxygen saturations were monitored continuously. The                            Model CF-HQ190L [id number]) scope was introduced   through the anus and advanced to the the cecum,                            identified by appendiceal orifice and ileocecal                            valve. The colonoscopy was performed without                            difficulty. The patient tolerated the procedure                            well. The quality of the bowel preparation was                            excellent. The ileocecal valve, appendiceal                            orifice, and rectum were photographed. The bowel                            preparation used was Miralax. Scope In: 10:11:08 AM Scope Out: 10:23:02 AM Scope Withdrawal Time: 0 hours 8 minutes 7 seconds  Total Procedure Duration: 0 hours 11 minutes 54 seconds  Findings:                 The perianal and digital rectal examinations were                            normal.                           A diminutive polyp was found in the cecum. The                            polyp was sessile. The polyp was removed with a                            cold snare. Resection and retrieval were complete.                            Verification of patient identification for the  specimen was done. Estimated blood loss was minimal.                           The exam was otherwise without abnormality on                            direct and retroflexion views. Complications:            No immediate complications. Estimated Blood Loss:     Estimated blood loss was minimal. Impression:               - One diminutive polyp in the cecum, removed with a                            cold snare. Resected and retrieved. Photo omitted                           - The examination was otherwise normal on direct                            and retroflexion views. Recommendation:           - Patient has a contact number available for                            emergencies. The signs and symptoms of potential                            delayed complications were  discussed with the                            patient. Return to normal activities tomorrow.                            Written discharge instructions were provided to the                            patient.                           - Resume previous diet.                           - Continue present medications.                           - Await pathology results.                           - Repeat colonoscopy is recommended. The                            colonoscopy date will be determined after pathology                            results from today's exam become available for  review. Gatha Mayer, MD 09/11/2017 10:30:37 AM This report has been signed electronically.

## 2017-09-11 NOTE — Progress Notes (Signed)
Pt taken to recovery, VSS, A&O, pleased with MAC. Report given to RN.

## 2017-09-11 NOTE — Progress Notes (Signed)
Pt's states no medical or surgical changes since previsit or office visit. 

## 2017-09-11 NOTE — Patient Instructions (Addendum)
   I found and removed one little polyp. It looks benign. I will let you know pathology results and when to have another routine colonoscopy by mail and/or My Chart.  I appreciate the opportunity to care for you. Gatha Mayer, MD, FACG  YOU HAD AN ENDOSCOPIC PROCEDURE TODAY AT [facility]:   Refer to the procedure report that was given to you for any specific questions about what was found during the examination.  If the procedure report does not answer your questions, please call your gastroenterologist to clarify.  If you requested that your care partner not be given the details of your procedure findings, then the procedure report has been included in a sealed envelope for you to review at your convenience later.  YOU SHOULD EXPECT: Some feelings of bloating in the abdomen. Passage of more gas than usual.  Walking can help get rid of the air that was put into your GI tract during the procedure and reduce the bloating. If you had a lower endoscopy (such as a colonoscopy or flexible sigmoidoscopy) you may notice spotting of blood in your stool or on the toilet paper. If you underwent a bowel prep for your procedure, you may not have a normal bowel movement for a few days.  Please Note:  You might notice some irritation and congestion in your nose or some drainage.  This is from the oxygen used during your procedure.  There is no need for concern and it should clear up in a day or so.  SYMPTOMS TO REPORT IMMEDIATELY:   Following lower endoscopy (colonoscopy or flexible sigmoidoscopy):  Excessive amounts of blood in the stool  Significant tenderness or worsening of abdominal pains  Swelling of the abdomen that is new, acute  Fever of 100F or higher  For urgent or emergent issues, a gastroenterologist can be reached at any hour by calling [phone].   DIET:  We do recommend a small meal at first, but then you may proceed to your regular diet.  Drink plenty of  fluids but you should avoid alcoholic beverages for 24 hours.  ACTIVITY:  You should plan to take it easy for the rest of today and you should NOT DRIVE or use heavy machinery until tomorrow (because of the sedation medicines used during the test).    FOLLOW UP: Our staff will call the number listed on your records the next business day following your procedure to check on you and address any questions or concerns that you may have regarding the information given to you following your procedure. If we do not reach you, we will leave a message.  However, if you are feeling well and you are not experiencing any problems, there is no need to return our call.  We will assume that you have returned to your regular daily activities without incident.  If any biopsies were taken you will be contacted by phone or by letter within the next 1-3 weeks.  Please call us at [phone] if you have not heard about the biopsies in 3 weeks.    SIGNATURES/CONFIDENTIALITY: You and/or your care partner have signed paperwork which will be entered into your electronic medical record.  These signatures attest to the fact that that the information above on your After Visit Summary has been reviewed and is understood.  Full responsibility of the confidentiality of this discharge information lies with you and/or your care-partner.

## 2017-09-11 NOTE — Progress Notes (Signed)
Called to room to assist during endoscopic procedure.  Patient ID and intended procedure confirmed with present staff. Received instructions for my participation in the procedure from the performing physician.  

## 2017-09-12 ENCOUNTER — Telehealth: Payer: Self-pay

## 2017-09-12 NOTE — Telephone Encounter (Signed)
Left message

## 2017-09-12 NOTE — Telephone Encounter (Signed)
No Answer, left message, B.Tyshaun Vinzant RN

## 2017-09-17 ENCOUNTER — Encounter: Payer: Self-pay | Admitting: Internal Medicine

## 2017-09-17 DIAGNOSIS — Z86010 Personal history of colonic polyps: Secondary | ICD-10-CM

## 2017-09-17 DIAGNOSIS — Z860101 Personal history of adenomatous and serrated colon polyps: Secondary | ICD-10-CM

## 2017-09-17 HISTORY — DX: Personal history of colonic polyps: Z86.010

## 2017-09-17 HISTORY — DX: Personal history of adenomatous and serrated colon polyps: Z86.0101

## 2017-09-17 NOTE — Progress Notes (Signed)
Diminutive adenoma Recall should be 5-10 years so 2026

## 2017-10-22 ENCOUNTER — Encounter: Payer: Self-pay | Admitting: Internal Medicine

## 2017-11-04 DIAGNOSIS — H25012 Cortical age-related cataract, left eye: Secondary | ICD-10-CM | POA: Diagnosis not present

## 2017-11-04 DIAGNOSIS — H5501 Congenital nystagmus: Secondary | ICD-10-CM | POA: Diagnosis not present

## 2017-11-04 DIAGNOSIS — H401111 Primary open-angle glaucoma, right eye, mild stage: Secondary | ICD-10-CM | POA: Diagnosis not present

## 2017-11-04 DIAGNOSIS — H2512 Age-related nuclear cataract, left eye: Secondary | ICD-10-CM | POA: Diagnosis not present

## 2017-11-12 DIAGNOSIS — Z13820 Encounter for screening for osteoporosis: Secondary | ICD-10-CM | POA: Diagnosis not present

## 2017-11-12 DIAGNOSIS — Z30432 Encounter for removal of intrauterine contraceptive device: Secondary | ICD-10-CM | POA: Diagnosis not present

## 2017-12-19 ENCOUNTER — Other Ambulatory Visit: Payer: BLUE CROSS/BLUE SHIELD

## 2017-12-19 DIAGNOSIS — Z79890 Hormone replacement therapy (postmenopausal): Secondary | ICD-10-CM | POA: Diagnosis not present

## 2017-12-19 DIAGNOSIS — Z833 Family history of diabetes mellitus: Secondary | ICD-10-CM | POA: Diagnosis not present

## 2017-12-19 DIAGNOSIS — E782 Mixed hyperlipidemia: Secondary | ICD-10-CM

## 2017-12-19 DIAGNOSIS — E669 Obesity, unspecified: Secondary | ICD-10-CM

## 2017-12-20 LAB — LIPID PANEL
CHOL/HDL RATIO: 3.9 (calc) (ref <5.0)
CHOLESTEROL: 286 mg/dL — AB (ref <200)
HDL: 74 mg/dL (ref >50)
LDL Cholesterol (Calc): 196 mg/dL (calc) — ABNORMAL HIGH
Non-HDL Cholesterol (Calc): 212 mg/dL (calc) — ABNORMAL HIGH (ref <130)
Triglycerides: 61 mg/dL (ref <150)

## 2017-12-20 LAB — CBC WITH DIFFERENTIAL/PLATELET
BASOS ABS: 21 {cells}/uL (ref 0–200)
Basophils Relative: 0.7 %
EOS ABS: 60 {cells}/uL (ref 15–500)
Eosinophils Relative: 2 %
HCT: 35.4 % (ref 35.0–45.0)
HEMOGLOBIN: 11.9 g/dL (ref 11.7–15.5)
LYMPHS ABS: 1425 {cells}/uL (ref 850–3900)
MCH: 28.5 pg (ref 27.0–33.0)
MCHC: 33.6 g/dL (ref 32.0–36.0)
MCV: 84.7 fL (ref 80.0–100.0)
MPV: 9.9 fL (ref 7.5–12.5)
Monocytes Relative: 11.1 %
NEUTROS ABS: 1161 {cells}/uL — AB (ref 1500–7800)
NEUTROS PCT: 38.7 %
PLATELETS: 246 10*3/uL (ref 140–400)
RBC: 4.18 10*6/uL (ref 3.80–5.10)
RDW: 12.6 % (ref 11.0–15.0)
Total Lymphocyte: 47.5 %
WBC mixed population: 333 cells/uL (ref 200–950)
WBC: 3 10*3/uL — ABNORMAL LOW (ref 3.8–10.8)

## 2017-12-20 LAB — TSH: TSH: 0.97 m[IU]/L

## 2017-12-20 LAB — COMPLETE METABOLIC PANEL WITH GFR
AG Ratio: 1.7 (calc) (ref 1.0–2.5)
ALT: 10 U/L (ref 6–29)
AST: 16 U/L (ref 10–35)
Albumin: 3.6 g/dL (ref 3.6–5.1)
Alkaline phosphatase (APISO): 49 U/L (ref 33–130)
BUN: 11 mg/dL (ref 7–25)
CALCIUM: 9 mg/dL (ref 8.6–10.4)
CO2: 30 mmol/L (ref 20–32)
CREATININE: 0.73 mg/dL (ref 0.50–1.05)
Chloride: 107 mmol/L (ref 98–110)
GFR, EST NON AFRICAN AMERICAN: 94 mL/min/{1.73_m2} (ref >=60)
GFR, Est African American: 109 mL/min/{1.73_m2} (ref >=60)
GLOBULIN: 2.1 g/dL (ref 1.9–3.7)
Glucose, Bld: 94 mg/dL (ref 65–99)
POTASSIUM: 4 mmol/L (ref 3.5–5.3)
SODIUM: 143 mmol/L (ref 135–146)
Total Bilirubin: 0.5 mg/dL (ref 0.2–1.2)
Total Protein: 5.7 g/dL — ABNORMAL LOW (ref 6.1–8.1)

## 2017-12-20 LAB — HEMOGLOBIN A1C
EAG (MMOL/L): 5.8 (calc)
Hgb A1c MFr Bld: 5.3 % of total Hgb (ref <5.7)
Mean Plasma Glucose: 105 (calc)

## 2017-12-30 ENCOUNTER — Encounter: Payer: Self-pay | Admitting: Internal Medicine

## 2017-12-30 ENCOUNTER — Ambulatory Visit (INDEPENDENT_AMBULATORY_CARE_PROVIDER_SITE_OTHER): Payer: BLUE CROSS/BLUE SHIELD | Admitting: Internal Medicine

## 2017-12-30 VITALS — BP 118/80 | HR 82 | Temp 97.4°F | Ht 66.0 in | Wt 219.0 lb

## 2017-12-30 DIAGNOSIS — E782 Mixed hyperlipidemia: Secondary | ICD-10-CM | POA: Diagnosis not present

## 2017-12-30 DIAGNOSIS — Z0000 Encounter for general adult medical examination without abnormal findings: Secondary | ICD-10-CM

## 2017-12-30 DIAGNOSIS — E669 Obesity, unspecified: Secondary | ICD-10-CM | POA: Diagnosis not present

## 2017-12-30 DIAGNOSIS — D72819 Decreased white blood cell count, unspecified: Secondary | ICD-10-CM

## 2017-12-30 MED ORDER — PRAVASTATIN SODIUM 10 MG PO TABS: 10.0000 mg | ORAL_TABLET | Freq: Every day | ORAL | 3 refills | Status: DC

## 2017-12-30 NOTE — Patient Instructions (Addendum)
RECOMMEND YOU CHECK WITH YOUR INSURANCE REGARDING A LIPID PROFILE TEST TO CHECK LEVELS OF SMALL VS LARGE LDL PARTICLES  THINK ABOUT INJECTABLE CHOLESTEROL LOWERING AGENT LIKE REPATHA  START PRAVASTATIN 10MG  AT BEDTIME TO LOWER CHOLESTEROL  FASTING LAB APPT IN 1 MONTH  Continue other medications as ordered  Follow up in 3 mos with Sherrie Mustache, NP-C.   Keeping You Healthy  Get These Tests  Blood Pressure- Have your blood pressure checked by your healthcare provider at least once a year.  Normal blood pressure is 120/80.  Weight- Have your body mass index (BMI) calculated to screen for obesity.  BMI is a measure of body fat based on height and weight.  You can calculate your own BMI at [employer].it  Cholesterol- Have your cholesterol checked every year.  Diabetes- Have your blood sugar checked every year if you have high blood pressure, high cholesterol, a family history of diabetes or if you are overweight.  Pap Test - Have a pap test every 1 to 5 years if you have been sexually active.  If you are older than 65 and recent pap tests have been normal you may not need additional pap tests.  In addition, if you have had a hysterectomy  for benign disease additional pap tests are not necessary.  Mammogram-Yearly mammograms are essential for early detection of breast cancer  Screening for Colon Cancer- Colonoscopy starting at age 22. Screening may begin sooner depending on your family history and other health conditions.  Follow up colonoscopy as directed by your Gastroenterologist.  Screening for Osteoporosis- Screening begins at age 74 with bone density scanning, sooner if you are at higher risk for developing Osteoporosis.  Get these medicines  Calcium with Vitamin D- Your body requires 1200-1500 mg of Calcium a day and [id number] IU of Vitamin D a day.  You can only absorb 500 mg of Calcium at a time therefore Calcium must be taken in 2 or 3 separate doses throughout the  day.  Hormones- Hormone therapy has been associated with increased risk for certain cancers and heart disease.  Talk to your healthcare provider about if you need relief from menopausal symptoms.  Aspirin- Ask your healthcare provider about taking Aspirin to prevent Heart Disease and Stroke.  Get these Immuniztions  Flu shot- Every fall  Pneumonia shot- Once after the age of 43; if you are younger ask your healthcare provider if you need a pneumonia shot.  Tetanus- Every ten years.  Zostavax- Once after the age of 99 to prevent shingles.  Take these steps  Don't smoke- Your healthcare provider can help you quit. For tips on how to quit, ask your healthcare provider or go to www.smokefree.gov or call 1-800 QUIT-NOW.  Be physically active- Exercise 5 days a week for a minimum of 30 minutes.  If you are not already physically active, start slow and gradually work up to 30 minutes of moderate physical activity.  Try walking, dancing, bike riding, swimming, etc.  Eat a healthy diet- Eat a variety of healthy foods such as fruits, vegetables, whole grains, low fat milk, low fat cheeses, yogurt, lean meats, chicken, fish, eggs, dried beans, tofu, etc.  For more information go to www.thenutritionsource.org  Dental visit- Brush and floss teeth twice daily; visit your dentist twice a year.  Eye exam- Visit your Optometrist or Ophthalmologist yearly.  Drink alcohol in moderation- Limit alcohol intake to one drink or less a day.  Never drink and drive.  Depression- Your emotional health is  as important as your physical health.  If you're feeling down or losing interest in things you normally enjoy, please talk to your healthcare provider.  Seat Belts- can save your life; always wear one  Smoke/Carbon Monoxide detectors- These detectors need to be installed on the appropriate level of your home.  Replace batteries at least once a year.  Violence- If anyone is threatening or hurting you, please  tell your healthcare provider. Living Will/ Health care power of attorney- Discuss with your healthcare provider and family.

## 2017-12-30 NOTE — Progress Notes (Signed)
Patient ID: Brenda Gonzales, female   DOB: 10/24/1965, 52 y.o.   MRN: [id number]   Location:  [facility]  Place of Service:  OFFICE  Provider: Arletha Grippe, DO  Patient Care Team: Gildardo Cranker, DO as PCP - General (Internal Medicine) Everlene Farrier, MD as Consulting Physician (Obstetrics and Gynecology) Rutherford Guys, MD as Consulting Physician (Ophthalmology)  Extended Emergency Contact Information Primary Emergency Contact: [name],[name] Address: [address]          [place],  [place] Phone: [phone] Relation: None  Code Status:  Goals of Care: Advanced Directive information Advanced Directives 08/29/2016  Does Patient Have a Medical Advance Directive? No     Chief Complaint  Patient presents with  . Annual Exam    Yearly Physical and EKG. Discus labs (copy given). Pap completed by GYN.   . Immunizations    Refused Flu Vaccine   . Health Maintenance    Discuss need for HIV screening     HPI: Patient is a 52 y.o. female seen in today for an annual wellness exam.    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 She uses eye gtts. She is legally blind.   Hyperlipidemia - she has taken cholesterol medication for many yrs but it was stopped about 1 yr ago.  She states HDL has been high but does not recall LDL and total cholesterol. Both parents with HTN and father has DM. No FHx CAD.   She had a mirena - it was removed in July 2019. She used it for menopausal sx's but it is ineffective. She has tried oral  HRT in the past that was also ineffective. Followed by [facility]   Depression screen [facility] 2/9 06/25/2017 03/18/2015  Decreased Interest 0 0  Down, Depressed, Hopeless 0 0  PHQ - 2 Score 0 0    Fall Risk  12/30/2017 06/25/2017  Falls in the past year? No No   No flowsheet data found.   Health Maintenance  Topic Date Due  . HIV Screening  12/13/1979  . MAMMOGRAM  05/03/2019  . PAP SMEAR  10/02/2020   . COLONOSCOPY  09/11/2024  . TETANUS/TDAP  10/20/2025  . INFLUENZA VACCINE  Discontinued    Urinary incontinence?  Functional Status Survey:    Exercise?  Diet?  No exam data present  Hearing:    Dentition:  Pain:  Past Medical History:  Diagnosis Date  . Anemia   . Arthritis   . Cataract   . Glaucoma   . Hx of adenomatous polyp of colon 09/17/2017  . Hyperlipidemia     Past Surgical History:  Procedure Laterality Date  . ABDOMINOPLASTY  2009  . EYE SURGERY    . TUBAL LIGATION  2007    Family History  Problem Relation Age of Onset  . Hypertension Mother   . Hyperlipidemia Mother   . Diabetes Father 64  . Hypertension Father   . Cancer Father   . Prostate cancer Father 4  . Colon cancer Neg Hx   . Esophageal cancer Neg Hx   . Liver cancer Neg Hx   . Pancreatic cancer Neg Hx   . Rectal cancer Neg Hx   . Stomach cancer Neg Hx    Family Status  Relation Name Status  . Mother [employer]  . Father Chestine Spore  . Neg Hx  (Not Specified)    shoulder  Social History  Socioeconomic History  . Marital status: Divorced    Spouse name: Not on file  . Number of children: Not on file  . Years of education: Not on file  . Highest education level: Not on file  Occupational History  . Not on file  Social Needs  . Financial resource strain: Not on file  . Food insecurity:    Worry: Not on file    Inability: Not on file  . Transportation needs:    Medical: Not on file    Non-medical: Not on file  Tobacco Use  . Smoking status: Never Smoker  . Smokeless tobacco: Never Used  Substance and Sexual Activity  . Alcohol use: Yes    Comment: less than one a month   . Drug use: Never  . Sexual activity: Not on file  Lifestyle  . Physical activity:    Days per week: Not on file    Minutes per session: Not on file  . Stress: Not on file  Relationships  . Social connections:    Talks on phone: Not on file    Gets together: Not on file     Attends religious service: Not on file    Active member of club or organization: Not on file    Attends meetings of clubs or organizations: Not on file    Relationship status: Not on file  . Intimate partner violence:    Fear of current or ex partner: Not on file    Emotionally abused: Not on file    Physically abused: Not on file    Forced sexual activity: Not on file  Other Topics Concern  . Not on file  Social History Narrative   Social History      Diet?       Do you drink/eat things with caffeine? yes      Marital status?          divorced                          What year were you married? 1991      Do you live in a house, apartment, assisted living, condo, trailer, etc.? condo      Is it one or more stories? Two stories      How many persons live in your home? One       Do you have any pets in your home? 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               yes                       Type & how often? Cardio twice per week      Advanced Directives      Do you have a living will? no      Do you have a DNR form?                                  If not, do you want to discuss one? no      Do you have signed POA/HPOA for forms? no      Functional Status      Do you have  difficulty bathing or dressing yourself? no      Do you have difficulty preparing food or eating? no      Do you have difficulty managing your medications? no      Do you have difficulty managing your finances? no      Do you have difficulty affording your medications?          Allergies  Allergen Reactions  . Benzoyl Peroxide Swelling  . Influenza Vaccines Other (See Comments)    Left side of body went numb x several days     Allergies as of 12/30/2017      Reactions   Benzoyl Peroxide Swelling   Influenza Vaccines Other (See Comments)   Left side of body went numb x  several days       Medication List        Accurate as of 12/30/17  3:33 PM. Always use your most recent med list.          brimonidine 0.15 % ophthalmic solution Commonly known as:  ALPHAGAN 1 drop in right eye twice daily   latanoprost 0.005 % ophthalmic solution Commonly known as:  XALATAN Place 1 drop into the right eye at bedtime.   MULTIVITAMIN WOMEN PO Take by mouth.        Review of Systems:  Review of Systems  Physical Exam: Vitals:   12/30/17 1442  BP: 118/80  Pulse: 82  Temp: (!) 97.4 F (36.3 C)  TempSrc: Oral  SpO2: 96%  Weight: 219 lb (99.3 kg)  Height: 5\' 6"  (1.676 m)   Body mass index is 35.35 kg/m. Physical Exam  Constitutional: She is oriented to person, place, and time. She appears well-developed and well-nourished. No distress.  HENT:  Head: Normocephalic and atraumatic.  Right Ear: Hearing, tympanic membrane, external ear and ear canal normal.  Left Ear: Hearing, tympanic membrane, external ear and ear canal normal.  Mouth/Throat: Uvula is midline, oropharynx is clear and moist and mucous membranes are normal. She does not have dentures.  Eyes: Pupils are equal, round, and reactive to light. Conjunctivae and lids are normal. No scleral icterus. Right eye exhibits abnormal extraocular motion and nystagmus (horizontal). Left eye exhibits abnormal extraocular motion and nystagmus (horizontal).  Blind in OU  Neck: Trachea normal and normal range of motion. Neck supple. Carotid bruit is not present. No thyroid mass and no thyromegaly present.  Cardiovascular: Normal rate, regular rhythm and intact distal pulses. Exam reveals no gallop and no friction rub.  Murmur (1/6 SEM) heard. R>LLE edema; no calf TTP  Pulmonary/Chest: Effort normal and breath sounds normal. She has no wheezes. She has no rhonchi. She has no rales. She exhibits no mass.  Breast exam deferred to GYN  Abdominal: Soft. Normal appearance, normal aorta and bowel sounds are  normal. She exhibits no pulsatile midline mass and no mass. There is no hepatosplenomegaly. There is no tenderness. There is no rigidity, no rebound and no guarding. No hernia.  Genitourinary:  Genitourinary Comments: Deferred to GYN  Musculoskeletal: Normal range of motion. She exhibits edema (small and large joints). She exhibits no deformity.  Lymphadenopathy:       Head (right side): No posterior auricular adenopathy present.       Head (left side): No posterior auricular adenopathy present.    She has no cervical adenopathy.       Right: No supraclavicular adenopathy present.       Left: No supraclavicular adenopathy present.  Neurological: She is alert  and oriented to person, place, and time. She has normal strength and normal reflexes. No cranial nerve deficit. Gait normal.  Skin: Skin is warm, dry and intact. No rash noted. Nails show no clubbing.  Psychiatric: She has a normal mood and affect. Her speech is normal and behavior is normal. Thought content normal. Cognition and memory are normal.    Labs reviewed:  Basic Metabolic Panel: Recent Labs    12/19/17 0851  NA 143  K 4.0  CL 107  CO2 30  GLUCOSE 94  BUN 11  CREATININE 0.73  CALCIUM 9.0  TSH 0.97   Liver Function Tests: Recent Labs    12/19/17 0851  AST 16  ALT 10  BILITOT 0.5  PROT 5.7*   No results for input(s): LIPASE, AMYLASE in the last 8760 hours. No results for input(s): AMMONIA in the last 8760 hours. CBC: Recent Labs    12/19/17 0851  WBC 3.0*  NEUTROABS 1,161*  HGB 11.9  HCT 35.4  MCV 84.7  PLT 246   Lipid Panel: Recent Labs    12/19/17 0851  CHOL 286*  HDL 74  LDLCALC 196*  TRIG 61  CHOLHDL 3.9   Lab Results  Component Value Date   HGBA1C 5.3 12/19/2017    Procedures: No results found. ECG OBTAINED AND REVIEWED BY MYSELF: NSR @ 66 bpm, nml axis, poor R wave progression. no acute ischemic changes. no other ECG available to compare  Assessment/Plan   ICD-10-CM   1.  Annual physical exam Z00.00 EKG 12-Lead  2. Mixed hyperlipidemia E78.2 EKG 12-Lead  3. Leukopenia, unspecified type D72.819   4. Obesity (BMI 35.0-39.9 without comorbidity) E66.9    RECOMMEND YOU CHECK WITH YOUR INSURANCE REGARDING A LIPID PROFILE TEST TO CHECK LEVELS OF SMALL VS LARGE LDL PARTICLES  THINK ABOUT INJECTABLE CHOLESTEROL LOWERING AGENT LIKE REPATHA  START PRAVASTATIN 10MG  AT BEDTIME TO LOWER CHOLESTEROL  FASTING LAB APPT IN 1 MONTH (lipid panel, ALT)  Continue other medications as ordered  Follow up in 3 mos with Sherrie Mustache, NP-C.   Meosha Castanon S. Perlie Gold  [facility] [address] [place], [place] [address] [phone] Cell (Monday-Friday 8 AM - 5 PM) [phone] After 5 PM and follow prompts

## 2018-01-23 ENCOUNTER — Other Ambulatory Visit: Payer: BLUE CROSS/BLUE SHIELD

## 2018-02-02 ENCOUNTER — Other Ambulatory Visit: Payer: Self-pay | Admitting: Nurse Practitioner

## 2018-02-02 ENCOUNTER — Other Ambulatory Visit: Payer: Self-pay

## 2018-02-02 DIAGNOSIS — E782 Mixed hyperlipidemia: Secondary | ICD-10-CM

## 2018-02-03 ENCOUNTER — Other Ambulatory Visit: Payer: BLUE CROSS/BLUE SHIELD

## 2018-02-03 DIAGNOSIS — E782 Mixed hyperlipidemia: Secondary | ICD-10-CM | POA: Diagnosis not present

## 2018-02-03 LAB — LIPID PANEL
CHOL/HDL RATIO: 3.2 (calc) (ref <5.0)
Cholesterol: 268 mg/dL — ABNORMAL HIGH (ref <200)
HDL: 85 mg/dL (ref >50)
LDL Cholesterol (Calc): 167 mg/dL (calc) — ABNORMAL HIGH
NON-HDL CHOLESTEROL (CALC): 183 mg/dL — AB (ref <130)
Triglycerides: 68 mg/dL (ref <150)

## 2018-02-04 ENCOUNTER — Telehealth: Payer: Self-pay

## 2018-02-04 DIAGNOSIS — E782 Mixed hyperlipidemia: Secondary | ICD-10-CM

## 2018-02-04 MED ORDER — PRAVASTATIN SODIUM 20 MG PO TABS: 20.0000 mg | ORAL_TABLET | Freq: Every day | ORAL | 1 refills | Status: DC

## 2018-02-04 NOTE — Telephone Encounter (Signed)
-----   Message from Lauree Chandler, NP sent at 02/03/2018  4:19 PM EST ----- Cholesterol still not at goal, would recommend increasing pravastatin to 20 mg by mouth daily at this time. To follow up lipid and CMP before next follow up

## 2018-02-04 NOTE — Telephone Encounter (Signed)
Discussed results with patient, patient verbalized understanding of results  Sent new rx for Pravastatin to [facility] on [address], patient will take 2 of the 10 mg tablets to use current supply and then pick up new rx.   Scheduled fasting lab appointment for 03/23/2018  Placed future orders

## 2018-02-06 DIAGNOSIS — H2512 Age-related nuclear cataract, left eye: Secondary | ICD-10-CM | POA: Diagnosis not present

## 2018-02-06 DIAGNOSIS — H5501 Congenital nystagmus: Secondary | ICD-10-CM | POA: Diagnosis not present

## 2018-02-06 DIAGNOSIS — H401111 Primary open-angle glaucoma, right eye, mild stage: Secondary | ICD-10-CM | POA: Diagnosis not present

## 2018-02-06 DIAGNOSIS — H25012 Cortical age-related cataract, left eye: Secondary | ICD-10-CM | POA: Diagnosis not present

## 2018-02-21 IMAGING — MR MR HEAD WO/W CM
12 of 13 series · 42 of 48 positions shown · IV contrast (multihance)
Comparison: Head CT 02/13/2008

CLINICAL DATA: Dizziness.  Vertigo.

EXAM:
MRI HEAD WITHOUT AND WITH CONTRAST
TECHNIQUE: Multiplanar, multiecho pulse sequences of the brain and surrounding
structures were obtained without and with intravenous contrast.
CONTRAST:  20mL MULTIHANCE GADOBENATE DIMEGLUMINE 529 MG/ML IV SOLN

[Series 2: T1 · sagittal · 5.0mm · 0.45mm/px · 1 of 23 slices shown]
[im 1/23]
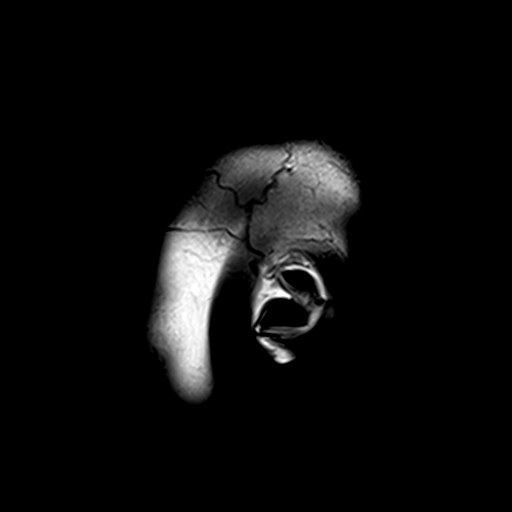

[Series 3: DWI · axial · 3.0mm · 1.80mm/px · z∈[-90,+55]mm · 5 of 100 slices shown (1 of 4)]
[im 1/100]
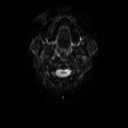
[im 25/100]
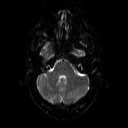
[im 50/100]
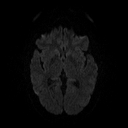
[im 75/100]
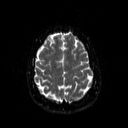
[im 100/100]
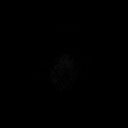

[Series 4: DWI · axial · 3.0mm · 1.80mm/px · z∈[-90,+55]mm · 3 of 49 slices shown (2 of 4)]
[im 1/49]
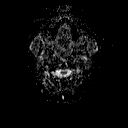
[im 25/49]
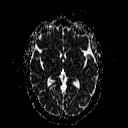
[im 49/49]
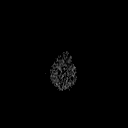

[Series 5: mip_images(sw) · axial · 16.0mm · 0.90mm/px · z∈[-90,+52]mm · 4 of 73 slices shown]
[im 1/73]
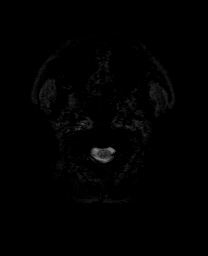
[im 25/73]
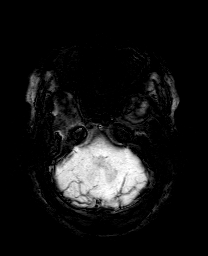
[im 49/73]
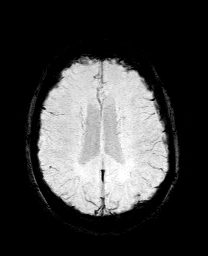
[im 73/73]
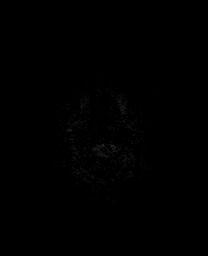

[Series 6: swi_images · axial · 2.0mm · 0.90mm/px · z∈[-97,+59]mm · 4 of 80 slices shown]
[im 1/80]
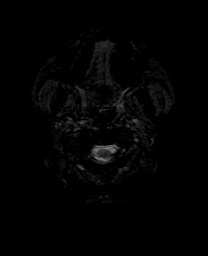
[im 27/80]
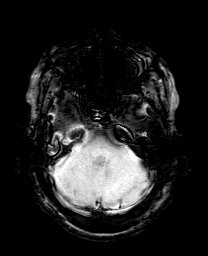
[im 53/80]
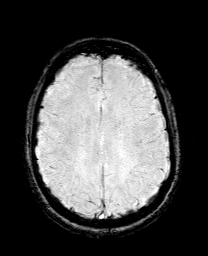
[im 80/80]
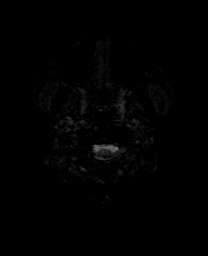

[Series 7: DWI · coronal · 5.0mm · 1.80mm/px · 4 of 71 slices shown (3 of 4)]
[im 1/71]
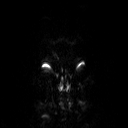
[im 24/71]
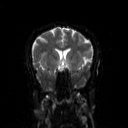
[im 47/71]
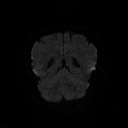
[im 71/71]
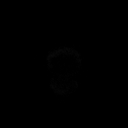

[Series 8: DWI · coronal · 5.0mm · 1.80mm/px · 2 of 36 slices shown (4 of 4)]
[im 1/36]
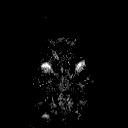
[im 36/36]
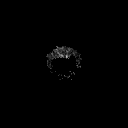

[Series 9: T2 · axial · 5.0mm · 0.51mm/px · 1 of 24 slices shown (1 of 2)]
[im 1/24]
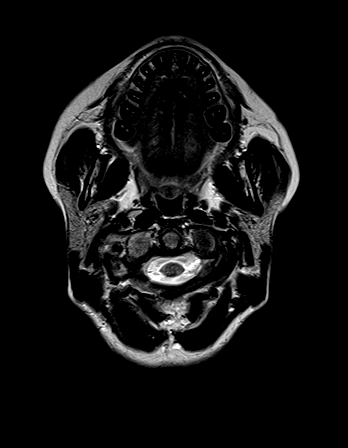

[Series 10: FLAIR · axial · 3.0mm · 0.45mm/px · z∈[-97,+58]mm · 2 of 27 slices shown]
[im 1/27]
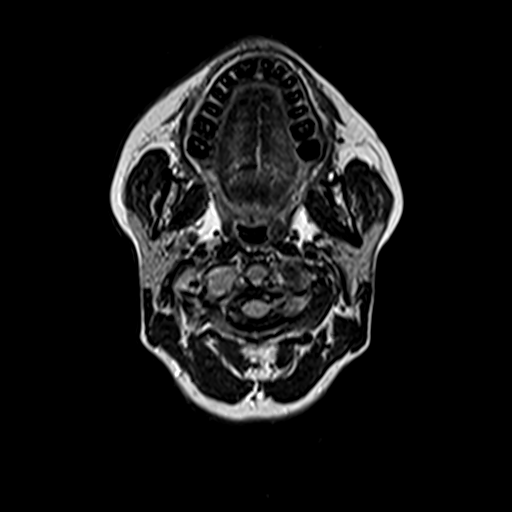
[im 27/27]
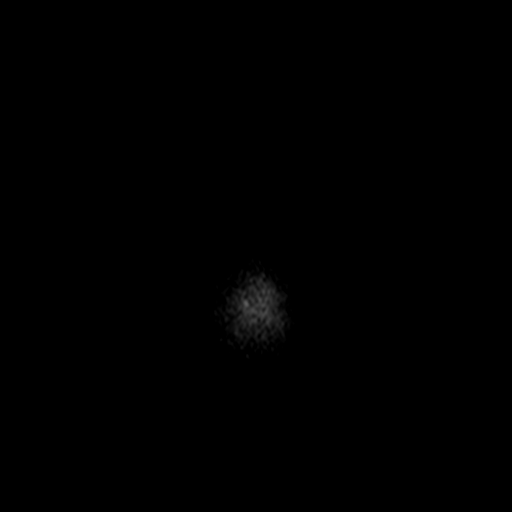

[Series 11: t1_mpr_tra · axial · 1.0mm · 0.45mm/px · z∈[-98,+60]mm · 8 of 160 slices shown (1 of 2)]
[im 1/160]
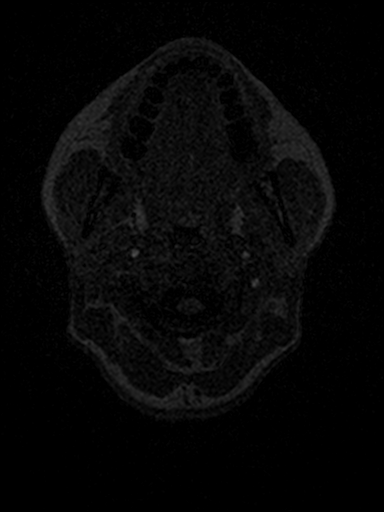
[im 20/160]
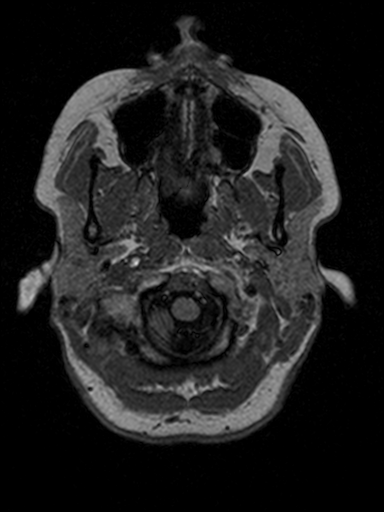
[im 40/160]
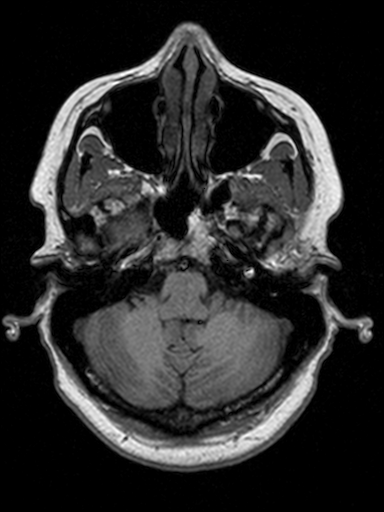
[im 60/160]
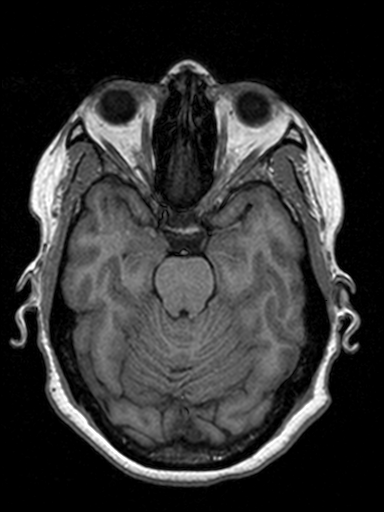
[im 100/160]
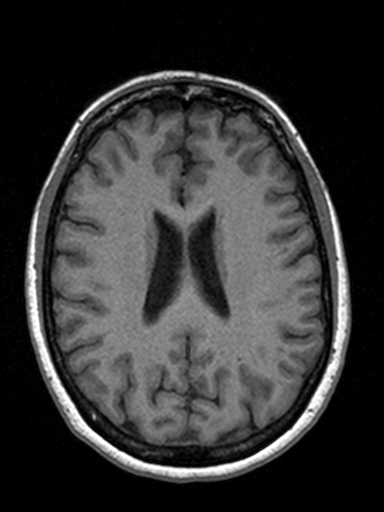
[im 120/160]
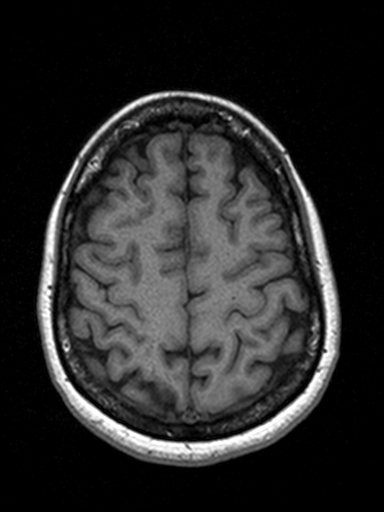
[im 140/160]
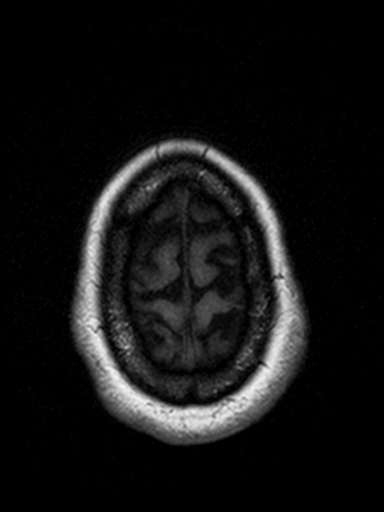
[im 160/160]
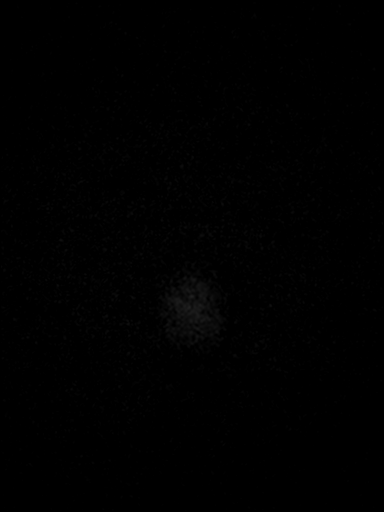

[Series 12: T2 · coronal · 5.0mm · 0.45mm/px · 2 of 30 slices shown (2 of 2)]
[im 1/30]
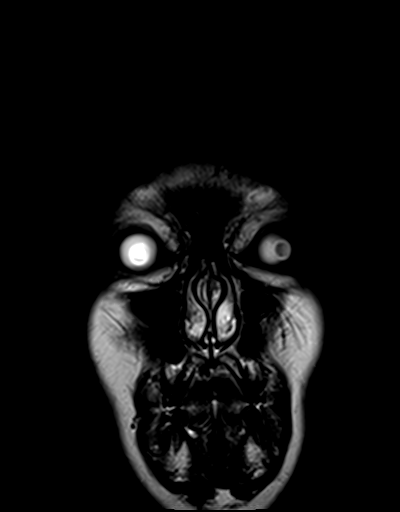
[im 30/30]
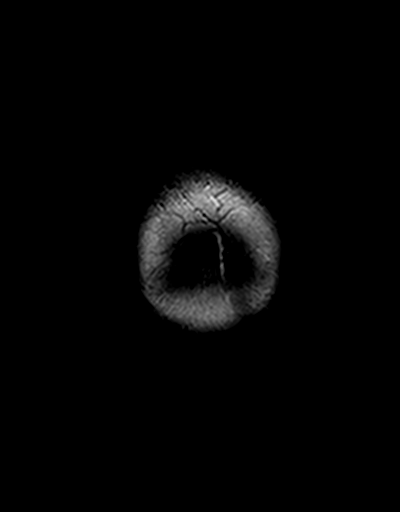

[Series 13: t1_mpr_tra · axial · 1.0mm · 0.45mm/px · z∈[-98,+20]mm · 6 of 160 slices shown (2 of 2)]
[im 1/160]
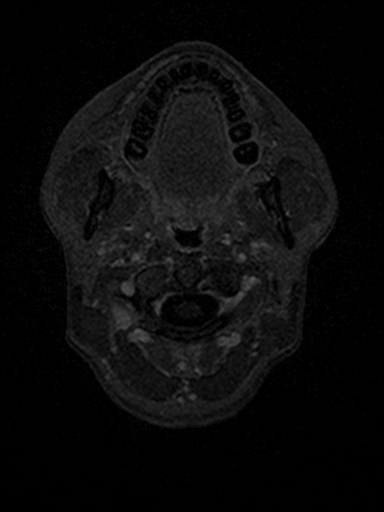
[im 20/160]
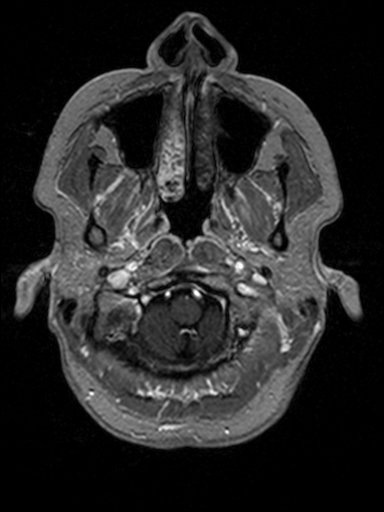
[im 40/160]
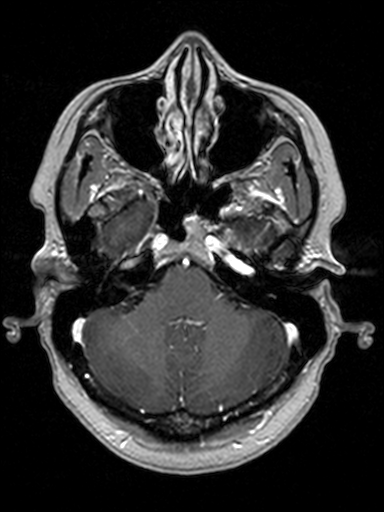
[im 60/160]
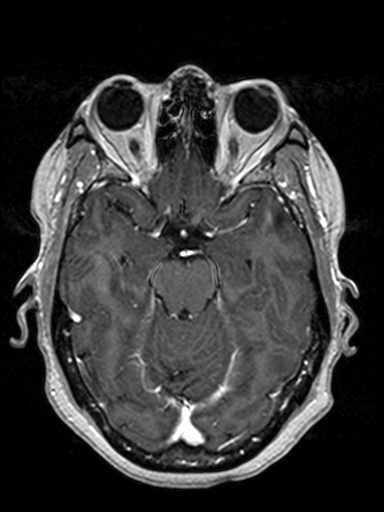
[im 100/160]
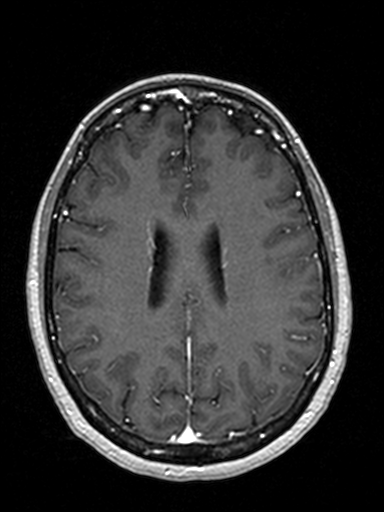
[im 120/160]
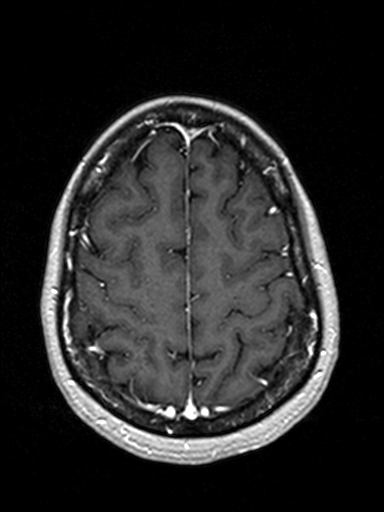

[42 of 48 positions shown; findings below may reference images not displayed]

FINDINGS: Brain: The midline structures are normal. There is no focal
diffusion restriction to indicate acute infarct. There is multifocal
hyperintense T2-weighted signal within the deep and subcortical
white matter, which may be seen in the setting of migraine headaches
or early chronic microvascular disease, but is generally
nonspecific. There is focus of susceptibility within the right
cerebellar hemisphere, likely indicating remote microhemorrhage.
Brain volume is normal for age without age-advanced or lobar
predominant atrophy. The dura is normal and there is no extra-axial
collection.

Vascular: Major intracranial arterial and venous sinus flow voids
are preserved.

Skull and upper cervical spine: The visualized skull base,
calvarium, upper cervical spine and extracranial soft tissues are
normal.

Sinuses/Orbits: No fluid levels or advanced mucosal thickening. No
mastoid effusion. Normal orbits.
IMPRESSION: No acute intracranial abnormality or specific finding to explain the
reported dizziness.

## 2018-03-23 ENCOUNTER — Other Ambulatory Visit: Payer: BLUE CROSS/BLUE SHIELD

## 2018-03-23 DIAGNOSIS — E782 Mixed hyperlipidemia: Secondary | ICD-10-CM

## 2018-03-24 LAB — COMPLETE METABOLIC PANEL WITH GFR
AG RATIO: 1.7 (calc) (ref 1.0–2.5)
ALBUMIN MSPROF: 3.6 g/dL (ref 3.6–5.1)
ALT: 10 U/L (ref 6–29)
AST: 15 U/L (ref 10–35)
Alkaline phosphatase (APISO): 55 U/L (ref 33–130)
BILIRUBIN TOTAL: 0.4 mg/dL (ref 0.2–1.2)
BUN: 11 mg/dL (ref 7–25)
CHLORIDE: 106 mmol/L (ref 98–110)
CO2: 30 mmol/L (ref 20–32)
Calcium: 8.8 mg/dL (ref 8.6–10.4)
Creat: 0.66 mg/dL (ref 0.50–1.05)
GFR, EST AFRICAN AMERICAN: 117 mL/min/{1.73_m2} (ref >=60)
GFR, Est Non African American: 101 mL/min/{1.73_m2} (ref >=60)
Globulin: 2.1 g/dL (calc) (ref 1.9–3.7)
Glucose, Bld: 84 mg/dL (ref 65–99)
POTASSIUM: 4.1 mmol/L (ref 3.5–5.3)
Sodium: 144 mmol/L (ref 135–146)
TOTAL PROTEIN: 5.7 g/dL — AB (ref 6.1–8.1)

## 2018-03-24 LAB — LIPID PANEL
Cholesterol: 230 mg/dL — ABNORMAL HIGH (ref <200)
HDL: 87 mg/dL (ref >50)
LDL Cholesterol (Calc): 128 mg/dL (calc) — ABNORMAL HIGH
Non-HDL Cholesterol (Calc): 143 mg/dL (calc) — ABNORMAL HIGH (ref <130)
Total CHOL/HDL Ratio: 2.6 (calc) (ref <5.0)
Triglycerides: 48 mg/dL (ref <150)

## 2018-04-01 ENCOUNTER — Ambulatory Visit: Payer: BLUE CROSS/BLUE SHIELD | Admitting: Nurse Practitioner

## 2018-04-20 ENCOUNTER — Ambulatory Visit (INDEPENDENT_AMBULATORY_CARE_PROVIDER_SITE_OTHER): Payer: BLUE CROSS/BLUE SHIELD | Admitting: Nurse Practitioner

## 2018-04-20 ENCOUNTER — Encounter: Payer: Self-pay | Admitting: Nurse Practitioner

## 2018-04-20 VITALS — BP 124/78 | HR 72 | Temp 98.2°F | Ht 66.0 in | Wt 216.4 lb

## 2018-04-20 DIAGNOSIS — E6609 Other obesity due to excess calories: Secondary | ICD-10-CM

## 2018-04-20 DIAGNOSIS — Z6834 Body mass index (BMI) 34.0-34.9, adult: Secondary | ICD-10-CM | POA: Diagnosis not present

## 2018-04-20 DIAGNOSIS — N951 Menopausal and female climacteric states: Secondary | ICD-10-CM | POA: Diagnosis not present

## 2018-04-20 DIAGNOSIS — E782 Mixed hyperlipidemia: Secondary | ICD-10-CM | POA: Diagnosis not present

## 2018-04-20 DIAGNOSIS — D72819 Decreased white blood cell count, unspecified: Secondary | ICD-10-CM

## 2018-04-20 NOTE — Progress Notes (Signed)
Careteam: Patient Care Team: Lauree Chandler, NP as PCP - General (Geriatric Medicine) Everlene Farrier, MD as Consulting Physician (Obstetrics and Gynecology) Rutherford Guys, MD as Consulting Physician (Ophthalmology)  Advanced Directive information Does Patient Have a Medical Advance Directive?: No, Would patient like information on creating a medical advance directive?: No - Patient declined  Allergies  Allergen Reactions  . Influenza Vaccines Other (See Comments)    Left side of body went numb x several days   . Benzoyl Peroxide Swelling    Chief Complaint  Patient presents with  . Medical Management of Chronic Issues    3 month follow up, discuss lab results      HPI: Patient is a 53 y.o. female seen in the office today for routine follow up OD Glaucoma/congenital cats with OS cats and hx right cat surgery with IOL - followed by Dr Gershon Crane. She uses eye gtts. She is legally blind.   Hyperlipidemia - hx of elevated LDL, had been off cholesterol medication. Was started on pravastatin and LDL has improved to 128. HDL at 87.   She had a mirena - it was removed in July 2019. She used it for menopausal sx's but it is ineffective. She has tried oral  HRT in the past that was also ineffective. Followed by [facility]. Post menopausal.   Low wbc count- has been worked up by hematologist and low wbc is her "Normal"   Her dad (Mr Ernestina Penna who was a pt here at [facility]) passed away last month after having pneumonia and had an MI she reports that he went peacefully and always loved coming to the office.   Review of Systems:  Review of Systems  Constitutional: Negative for chills, fever and weight loss.  HENT: Negative for tinnitus.   Respiratory: Negative for cough, sputum production and shortness of breath.   Cardiovascular: Negative for chest pain, palpitations and leg swelling.  Gastrointestinal: Negative for abdominal pain, constipation, diarrhea and heartburn.    Genitourinary: Negative for dysuria, frequency and urgency.  Musculoskeletal: Negative for back pain, falls, joint pain and myalgias.  Skin: Negative.   Neurological: Negative for dizziness and headaches.  Endo/Heme/Allergies:       Hot flashes  Psychiatric/Behavioral: Negative for depression and memory loss. The patient does not have insomnia.     Past Medical History:  Diagnosis Date  . Anemia   . Arthritis   . Cataract   . Glaucoma   . Hx of adenomatous polyp of colon 09/17/2017  . Hyperlipidemia    Past Surgical History:  Procedure Laterality Date  . ABDOMINOPLASTY  2009  . EYE SURGERY    . TUBAL LIGATION  2007   Social History:   reports that she has never smoked. She has never used smokeless tobacco. She reports current alcohol use. She reports that she does not use drugs.  Family History  Problem Relation Age of Onset  . Hypertension Mother   . Hyperlipidemia Mother   . Diabetes Father 52  . Hypertension Father   . Cancer Father   . Prostate cancer Father 69  . Colon cancer Neg Hx   . Esophageal cancer Neg Hx   . Liver cancer Neg Hx   . Pancreatic cancer Neg Hx   . Rectal cancer Neg Hx   . Stomach cancer Neg Hx     Medications: Patient's Medications  New Prescriptions   No medications on file  Previous Medications   BRIMONIDINE (ALPHAGAN) 0.15 % OPHTHALMIC  SOLUTION    1 drop in right eye twice daily   LATANOPROST (XALATAN) 0.005 % OPHTHALMIC SOLUTION    Place 1 drop into the right eye at bedtime.   MULTIPLE VITAMINS-MINERALS (MULTIVITAMIN WOMEN PO)    Take by mouth.   PRAVASTATIN (PRAVACHOL) 20 MG TABLET    Take 1 tablet (20 mg total) by mouth daily.  Modified Medications   No medications on file  Discontinued Medications   No medications on file     Physical Exam:  Vitals:   04/20/18 0900  BP: 124/78  Pulse: 72  Temp: 98.2 F (36.8 C)  TempSrc: Oral  SpO2: 98%  Weight: 216 lb 6.4 oz (98.2 kg)  Height: 5\' 6"  (1.676 m)   Body mass index  is 34.93 kg/m.  Physical Exam Constitutional:      Appearance: She is well-developed.  HENT:     Mouth/Throat:     Pharynx: No oropharyngeal exudate.  Eyes:     General: No scleral icterus.    Comments: Congential cataracts, legally blind  Neck:     Musculoskeletal: Neck supple.     Thyroid: No thyromegaly.     Vascular: No carotid bruit.     Trachea: No tracheal deviation.  Cardiovascular:     Rate and Rhythm: Normal rate and regular rhythm.     Heart sounds: Normal heart sounds. No murmur. No friction rub. No gallop.   Pulmonary:     Effort: Pulmonary effort is normal. No respiratory distress.     Breath sounds: Normal breath sounds. No stridor. No wheezing or rales.  Abdominal:     General: Bowel sounds are normal. There is no distension.     Palpations: Abdomen is soft. There is no hepatomegaly or mass.     Tenderness: There is no abdominal tenderness. There is no guarding or rebound.  Lymphadenopathy:     Cervical: No cervical adenopathy.  Skin:    General: Skin is warm and dry.     Findings: No rash.  Neurological:     Mental Status: She is alert and oriented to person, place, and time.     Deep Tendon Reflexes: Reflexes are normal and symmetric.  Psychiatric:        Behavior: Behavior normal.        Thought Content: Thought content normal.        Judgment: Judgment normal.     Labs reviewed: Basic Metabolic Panel: Recent Labs    12/19/17 0851 03/23/18 0843  NA 143 144  K 4.0 4.1  CL 107 106  CO2 30 30  GLUCOSE 94 84  BUN 11 11  CREATININE 0.73 0.66  CALCIUM 9.0 8.8  TSH 0.97  --    Liver Function Tests: Recent Labs    12/19/17 0851 03/23/18 0843  AST 16 15  ALT 10 10  BILITOT 0.5 0.4  PROT 5.7* 5.7*   No results for input(s): LIPASE, AMYLASE in the last 8760 hours. No results for input(s): AMMONIA in the last 8760 hours. CBC: Recent Labs    12/19/17 0851  WBC 3.0*  NEUTROABS 1,161*  HGB 11.9  HCT 35.4  MCV 84.7  PLT 246   Lipid  Panel: Recent Labs    12/19/17 0851 02/03/18 0819 03/23/18 0843  CHOL 286* 268* 230*  HDL 74 85 87  LDLCALC 196* 167* 128*  TRIG 61 68 48  CHOLHDL 3.9 3.2 2.6   TSH: Recent Labs    12/19/17 0851  TSH 0.97  A1C: Lab Results  Component Value Date   HGBA1C 5.3 12/19/2017     Assessment/Plan 1. Body mass index (BMI) of 34.0-34.9 in adult Noted today  2. Class 1 obesity due to excess calories without serious comorbidity with body mass index (BMI) of 34.0 to 34.9 in adult Noted she is working on diet and exercise modifications. Has lost 3 lbs recently  3. Mixed hyperlipidemia Improved since on statin. No myalgias noted.  - COMPLETE METABOLIC PANEL WITH GFR; Future - Lipid panel; Future  4. Hot flash, menopausal -previously had mirena for symptoms but this has now been removed. Continues to follow with GYN. Will let us know if this worsens.   5. Leukopenia, unspecified type -chronic - CBC with Differential/Platelets; Future  Next appt: 6 months, labs before visit.  Carlos American. [place], [facility] [phone]

## 2018-08-16 ENCOUNTER — Other Ambulatory Visit: Payer: Self-pay | Admitting: Nurse Practitioner

## 2018-10-16 ENCOUNTER — Other Ambulatory Visit: Payer: BLUE CROSS/BLUE SHIELD

## 2018-10-21 ENCOUNTER — Ambulatory Visit: Payer: BLUE CROSS/BLUE SHIELD | Admitting: Nurse Practitioner

## 2018-10-26 ENCOUNTER — Encounter: Payer: Self-pay | Admitting: Nurse Practitioner

## 2018-11-03 ENCOUNTER — Other Ambulatory Visit: Payer: Self-pay

## 2018-11-03 DIAGNOSIS — D72819 Decreased white blood cell count, unspecified: Secondary | ICD-10-CM

## 2018-11-03 DIAGNOSIS — E782 Mixed hyperlipidemia: Secondary | ICD-10-CM | POA: Diagnosis not present

## 2018-11-03 LAB — COMPLETE METABOLIC PANEL WITH GFR
AG Ratio: 1.8 (calc) (ref 1.0–2.5)
ALT: 9 U/L (ref 6–29)
AST: 16 U/L (ref 10–35)
Albumin: 3.7 g/dL (ref 3.6–5.1)
Alkaline phosphatase (APISO): 47 U/L (ref 37–153)
BUN: 14 mg/dL (ref 7–25)
CO2: 31 mmol/L (ref 20–32)
Calcium: 9.3 mg/dL (ref 8.6–10.4)
Chloride: 106 mmol/L (ref 98–110)
Creat: 0.74 mg/dL (ref 0.50–1.05)
GFR, Est African American: 107 mL/min/{1.73_m2} (ref >=60)
GFR, Est Non African American: 92 mL/min/{1.73_m2} (ref >=60)
Globulin: 2.1 g/dL (calc) (ref 1.9–3.7)
Glucose, Bld: 96 mg/dL (ref 65–99)
Potassium: 4.2 mmol/L (ref 3.5–5.3)
Sodium: 142 mmol/L (ref 135–146)
Total Bilirubin: 0.4 mg/dL (ref 0.2–1.2)
Total Protein: 5.8 g/dL — ABNORMAL LOW (ref 6.1–8.1)

## 2018-11-03 LAB — CBC WITH DIFFERENTIAL/PLATELET
Absolute Monocytes: 300 cells/uL (ref 200–950)
Basophils Absolute: 30 cells/uL (ref 0–200)
Basophils Relative: 0.9 %
Eosinophils Absolute: 59 cells/uL (ref 15–500)
Eosinophils Relative: 1.8 %
HCT: 37.7 % (ref 35.0–45.0)
Hemoglobin: 12.5 g/dL (ref 11.7–15.5)
Lymphs Abs: 1366 cells/uL (ref 850–3900)
MCH: 29.3 pg (ref 27.0–33.0)
MCHC: 33.2 g/dL (ref 32.0–36.0)
MCV: 88.3 fL (ref 80.0–100.0)
MPV: 10.5 fL (ref 7.5–12.5)
Monocytes Relative: 9.1 %
Neutro Abs: 1544 cells/uL (ref 1500–7800)
Neutrophils Relative %: 46.8 %
Platelets: 235 10*3/uL (ref 140–400)
RBC: 4.27 10*6/uL (ref 3.80–5.10)
RDW: 12.9 % (ref 11.0–15.0)
Total Lymphocyte: 41.4 %
WBC: 3.3 10*3/uL — ABNORMAL LOW (ref 3.8–10.8)

## 2018-11-03 LAB — LIPID PANEL
Cholesterol: 239 mg/dL — ABNORMAL HIGH (ref <200)
HDL: 80 mg/dL (ref >=50)
LDL Cholesterol (Calc): 145 mg/dL (calc) — ABNORMAL HIGH
Non-HDL Cholesterol (Calc): 159 mg/dL (calc) — ABNORMAL HIGH (ref <130)
Total CHOL/HDL Ratio: 3 (calc) (ref <5.0)
Triglycerides: 46 mg/dL (ref <150)

## 2018-11-04 ENCOUNTER — Ambulatory Visit: Payer: BC Managed Care – PPO | Admitting: Nurse Practitioner

## 2018-11-05 ENCOUNTER — Ambulatory Visit: Payer: BC Managed Care – PPO | Admitting: Nurse Practitioner

## 2018-11-25 ENCOUNTER — Other Ambulatory Visit: Payer: Self-pay

## 2018-11-25 ENCOUNTER — Encounter: Payer: Self-pay | Admitting: Nurse Practitioner

## 2018-11-25 ENCOUNTER — Ambulatory Visit (INDEPENDENT_AMBULATORY_CARE_PROVIDER_SITE_OTHER): Payer: BC Managed Care – PPO | Admitting: Nurse Practitioner

## 2018-11-25 VITALS — BP 136/80 | HR 56 | Temp 98.2°F | Wt 212.2 lb

## 2018-11-25 DIAGNOSIS — D72819 Decreased white blood cell count, unspecified: Secondary | ICD-10-CM

## 2018-11-25 DIAGNOSIS — Z6834 Body mass index (BMI) 34.0-34.9, adult: Secondary | ICD-10-CM | POA: Diagnosis not present

## 2018-11-25 DIAGNOSIS — E782 Mixed hyperlipidemia: Secondary | ICD-10-CM | POA: Diagnosis not present

## 2018-11-25 DIAGNOSIS — E6609 Other obesity due to excess calories: Secondary | ICD-10-CM

## 2018-11-25 DIAGNOSIS — N951 Menopausal and female climacteric states: Secondary | ICD-10-CM

## 2018-11-25 DIAGNOSIS — Z23 Encounter for immunization: Secondary | ICD-10-CM

## 2018-11-25 NOTE — Patient Instructions (Signed)
To increase physical activity- 30 mins 5 days as week Continue to work on dietary modifications    Heart-Healthy Eating Plan Many factors influence your heart (coronary) health, including eating and exercise habits. Coronary risk increases with abnormal blood fat (lipid) levels. Heart-healthy meal planning includes limiting unhealthy fats, increasing healthy fats, and making other diet and lifestyle changes. What are tips for following this plan? Cooking Cook foods using methods other than frying. Baking, boiling, grilling, and broiling are all good options. Other ways to reduce fat include:  Removing the skin from poultry.  Removing all visible fats from meats.  Steaming vegetables in water or broth. Meal planning   At meals, imagine dividing your plate into fourths: ? Fill one-half of your plate with vegetables and green salads. ? Fill one-fourth of your plate with whole grains. ? Fill one-fourth of your plate with lean protein foods.  Eat 4-5 servings of vegetables per day. One serving equals 1 cup raw or cooked vegetable, or 2 cups raw leafy greens.  Eat 4-5 servings of fruit per day. One serving equals 1 medium whole fruit,  cup dried fruit,  cup fresh, frozen, or canned fruit, or  cup 100% fruit juice.  Eat more foods that contain soluble fiber. Examples include apples, broccoli, carrots, beans, peas, and barley. Aim to get 25-30 g of fiber per day.  Increase your consumption of legumes, nuts, and seeds to 4-5 servings per week. One serving of dried beans or legumes equals  cup cooked, 1 serving of nuts is  cup, and 1 serving of seeds equals 1 tablespoon. Fats  Choose healthy fats more often. Choose monounsaturated and polyunsaturated fats, such as olive and canola oils, flaxseeds, walnuts, almonds, and seeds.  Eat more omega-3 fats. Choose salmon, mackerel, sardines, tuna, flaxseed oil, and ground flaxseeds. Aim to eat fish at least 2 times each week.  Check food  labels carefully to identify foods with trans fats or high amounts of saturated fat.  Limit saturated fats. These are found in animal products, such as meats, butter, and cream. Plant sources of saturated fats include palm oil, palm kernel oil, and coconut oil.  Avoid foods with partially hydrogenated oils in them. These contain trans fats. Examples are stick margarine, some tub margarines, cookies, crackers, and other baked goods.  Avoid fried foods. General information  Eat more home-cooked food and less restaurant, buffet, and fast food.  Limit or avoid alcohol.  Limit foods that are high in starch and sugar.  Lose weight if you are overweight. Losing just 5-10% of your body weight can help your overall health and prevent diseases such as diabetes and heart disease.  Monitor your salt (sodium) intake, especially if you have high blood pressure. Talk with your health care provider about your sodium intake.  Try to incorporate more vegetarian meals weekly. What foods can I eat? Fruits All fresh, canned (in natural juice), or frozen fruits. Vegetables Fresh or frozen vegetables (raw, steamed, roasted, or grilled). Green salads. Grains Most grains. Choose whole wheat and whole grains most of the time. Rice and pasta, including brown rice and pastas made with whole wheat. Meats and other proteins Lean, well-trimmed beef, veal, pork, and lamb. Chicken and [place] without skin. All fish and shellfish. Wild duck, rabbit, pheasant, and venison. Egg whites or low-cholesterol egg substitutes. Dried beans, peas, lentils, and tofu. Seeds and most nuts. Dairy Low-fat or nonfat cheeses, including ricotta and mozzarella. Skim or 1% milk (liquid, powdered, or evaporated). Buttermilk made  with low-fat milk. Nonfat or low-fat yogurt. Fats and oils Non-hydrogenated (trans-free) margarines. Vegetable oils, including soybean, sesame, sunflower, olive, peanut, safflower, corn, canola, and cottonseed.  Salad dressings or mayonnaise made with a vegetable oil. Beverages Water (mineral or sparkling). Coffee and tea. Diet carbonated beverages. Sweets and desserts Sherbet, gelatin, and fruit ice. Small amounts of dark chocolate. Limit all sweets and desserts. Seasonings and condiments All seasonings and condiments. The items listed above may not be a complete list of foods and beverages you can eat. Contact a dietitian for more options. What foods are not recommended? Fruits Canned fruit in heavy syrup. Fruit in cream or butter sauce. Fried fruit. Limit coconut. Vegetables Vegetables cooked in cheese, cream, or butter sauce. Fried vegetables. Grains Breads made with saturated or trans fats, oils, or whole milk. Croissants. Sweet rolls. Donuts. High-fat crackers, such as cheese crackers. Meats and other proteins Fatty meats, such as hot dogs, ribs, sausage, bacon, rib-eye roast or steak. High-fat deli meats, such as salami and bologna. Caviar. Domestic duck and goose. Organ meats, such as liver. Dairy Cream, sour cream, cream cheese, and creamed cottage cheese. Whole milk cheeses. Whole or 2% milk (liquid, evaporated, or condensed). Whole buttermilk. Cream sauce or high-fat cheese sauce. Whole-milk yogurt. Fats and oils Meat fat, or shortening. Cocoa butter, hydrogenated oils, palm oil, coconut oil, palm kernel oil. Solid fats and shortenings, including bacon fat, salt pork, lard, and butter. Nondairy cream substitutes. Salad dressings with cheese or sour cream. Beverages Regular sodas and any drinks with added sugar. Sweets and desserts Frosting. Pudding. Cookies. Cakes. Pies. Milk chocolate or white chocolate. Buttered syrups. Full-fat ice cream or ice cream drinks. The items listed above may not be a complete list of foods and beverages to avoid. Contact a dietitian for more information. Summary  Heart-healthy meal planning includes limiting unhealthy fats, increasing healthy fats, and  making other diet and lifestyle changes.  Lose weight if you are overweight. Losing just 5-10% of your body weight can help your overall health and prevent diseases such as diabetes and heart disease.  Focus on eating a balance of foods, including fruits and vegetables, low-fat or nonfat dairy, lean protein, nuts and legumes, whole grains, and heart-healthy oils and fats. This information is not intended to replace advice given to you by your health care provider. Make sure you discuss any questions you have with your health care provider. Document Released: 11/28/2007 Document Revised: 03/28/2017 Document Reviewed: 03/28/2017 Elsevier Patient Education  2020 [employer].

## 2018-11-25 NOTE — Progress Notes (Signed)
Careteam: Patient Care Team: Lauree Chandler, NP as PCP - General (Geriatric Medicine) Everlene Farrier, MD as Consulting Physician (Obstetrics and Gynecology) Rutherford Guys, MD as Consulting Physician (Ophthalmology)  Advanced Directive information    Allergies  Allergen Reactions  . Influenza Vaccines Other (See Comments)    Left side of body went numb x several days   . Benzoyl Peroxide Swelling    Chief Complaint  Patient presents with  . Medical Management of Chronic Issues    6 month follow up     HPI: Patient is a 53 y.o. female seen in the office today for routine follow up.   OD Glaucoma/congenital cats with OS cats and hx right cat surgery with IOL - followed by Dr Gershon Crane. She uses eye gtts. She is legally blind.   Hyperlipidemia - hx of elevated LDL, had been off cholesterol medication. Was started on pravastatin and LDL had improved to 128. HDL at 87. reports she has "fallen into bad habits" since living with her mom. LDL up to 145 but HDL 80  She hada mirena - it wasremovedin July 2019. She usedit for menopausal sx's but it is ineffective. She has tried oral HRT in the past that was also ineffective. Followed by [facility]. Post menopausal. Not currently on medications.   Low wbc count- has been worked up by hematologist and low wbc is her "Normal"   Reports she had to work through the height of the pandemic.  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 They have good cleaning in place.   Does not get flu shots. numb in the arm  when she got injection last time which was several years ago and has been put off by it and then that was the first and last time she had gotten it.  Had tetanus shot since then and tolerated it well.   Review of Systems:  Review of Systems  Constitutional: Negative for chills, fever and weight loss.  HENT: Negative for tinnitus.   Respiratory: Negative for cough, sputum production and  shortness of breath.   Cardiovascular: Negative for chest pain, palpitations and leg swelling.  Gastrointestinal: Negative for abdominal pain, constipation, diarrhea and heartburn.  Genitourinary: Negative for dysuria, frequency and urgency.  Musculoskeletal: Negative for back pain, falls, joint pain and myalgias.  Skin: Negative.   Neurological: Negative for dizziness and headaches.  Endo/Heme/Allergies:       Hot flashes  Psychiatric/Behavioral: Negative for depression and memory loss. The patient does not have insomnia.     Past Medical History:  Diagnosis Date  . Anemia   . Arthritis   . Cataract   . Glaucoma   . Hx of adenomatous polyp of colon 09/17/2017  . Hyperlipidemia    Past Surgical History:  Procedure Laterality Date  . ABDOMINOPLASTY  2009  . EYE SURGERY    . TUBAL LIGATION  2007   Social History:   reports that she has never smoked. She has never used smokeless tobacco. She reports current alcohol use. She reports that she does not use drugs.  Family History  Problem Relation Age of Onset  . Hypertension Mother   . Hyperlipidemia Mother   . Diabetes Father 73  . Hypertension Father   . Cancer Father   . Prostate cancer Father 74  . Colon cancer Neg Hx   . Esophageal cancer Neg Hx   . Liver cancer Neg Hx   . Pancreatic  cancer Neg Hx   . Rectal cancer Neg Hx   . Stomach cancer Neg Hx     Medications: Patient's Medications  New Prescriptions   No medications on file  Previous Medications   BRIMONIDINE (ALPHAGAN) 0.15 % OPHTHALMIC SOLUTION    1 drop in right eye twice daily   LATANOPROST (XALATAN) 0.005 % OPHTHALMIC SOLUTION    Place 1 drop into the right eye at bedtime.   MULTIPLE VITAMINS-MINERALS (MULTIVITAMIN WOMEN PO)    Take by mouth.   PRAVASTATIN (PRAVACHOL) 20 MG TABLET    TAKE 1 TABLET BY MOUTH EVERY DAY  Modified Medications   No medications on file  Discontinued Medications   No medications on file    Physical Exam:  Vitals:    11/25/18 1303  BP: 136/80  Pulse: (!) 56  Temp: 98.2 F (36.8 C)  SpO2: 99%  Weight: 212 lb 3.2 oz (96.3 kg)   Body mass index is 34.25 kg/m. Wt Readings from Last 3 Encounters:  11/25/18 212 lb 3.2 oz (96.3 kg)  04/20/18 216 lb 6.4 oz (98.2 kg)  12/30/17 219 lb (99.3 kg)    Physical Exam Constitutional:      Appearance: She is well-developed.  HENT:     Right Ear: External ear normal.     Left Ear: External ear normal.  Eyes:     Comments: Congential cataracts, legally blind  Neck:     Musculoskeletal: Neck supple.     Thyroid: No thyromegaly.     Vascular: No carotid bruit.     Trachea: No tracheal deviation.  Cardiovascular:     Rate and Rhythm: Normal rate and regular rhythm.     Heart sounds: Normal heart sounds. No murmur. No friction rub. No gallop.   Pulmonary:     Effort: Pulmonary effort is normal. No respiratory distress.     Breath sounds: Normal breath sounds. No stridor. No wheezing or rales.  Abdominal:     General: Bowel sounds are normal. There is no distension.     Palpations: Abdomen is soft. There is no hepatomegaly or mass.     Tenderness: There is no abdominal tenderness. There is no guarding or rebound.  Lymphadenopathy:     Cervical: No cervical adenopathy.  Skin:    General: Skin is warm and dry.     Findings: No rash.  Neurological:     Mental Status: She is alert and oriented to person, place, and time.     Deep Tendon Reflexes: Reflexes are normal and symmetric.  Psychiatric:        Behavior: Behavior normal.        Thought Content: Thought content normal.        Judgment: Judgment normal.     Labs reviewed: Basic Metabolic Panel: Recent Labs    12/19/17 0851 03/23/18 0843 11/03/18 0807  NA 143 144 142  K 4.0 4.1 4.2  CL 107 106 106  CO2 30 30 31   GLUCOSE 94 84 96  BUN 11 11 14   CREATININE 0.73 0.66 0.74  CALCIUM 9.0 8.8 9.3  TSH 0.97  --   --    Liver Function Tests: Recent Labs    12/19/17 0851 03/23/18 0843  11/03/18 0807  AST 16 15 16   ALT 10 10 9   BILITOT 0.5 0.4 0.4  PROT 5.7* 5.7* 5.8*   No results for input(s): LIPASE, AMYLASE in the last 8760 hours. No results for input(s): AMMONIA in the last 8760 hours. CBC: Recent  Labs    12/19/17 0851 11/03/18 0807  WBC 3.0* 3.3*  NEUTROABS 1,161* 1,544  HGB 11.9 12.5  HCT 35.4 37.7  MCV 84.7 88.3  PLT 246 235   Lipid Panel: Recent Labs    02/03/18 0819 03/23/18 0843 11/03/18 0807  CHOL 268* 230* 239*  HDL 85 87 80  LDLCALC 167* 128* 145*  TRIG 68 48 46  CHOLHDL 3.2 2.6 3.0   TSH: Recent Labs    12/19/17 0851  TSH 0.97   A1C: Lab Results  Component Value Date   HGBA1C 5.3 12/19/2017     Assessment/Plan 1. Class 1 obesity due to excess calories without serious comorbidity with body mass index (BMI) of 34.0 to 34.9 in adult Discussed increase in exercise and working on diet modifications.   2. Body mass index (BMI) of 34.0-34.9 in adult Noted today  3. Mixed hyperlipidemia -LDL slightly worse but she has not been doing as good with diet. Plans to work on this.  -continues on pravastatin. - COMPLETE METABOLIC PANEL WITH GFR; Future - Lipid panel; Future  4. Hot flash, menopausal Improved at this time  5. Leukopenia, unspecified type -wbc stable at this time.  6. Need for influenza vaccination -appears to have been given vaccine incorrectly the last time she had it.  - Flu Vaccine QUAD 6+ mos PF IM (Fluarix Quad PF)  Next appt: 6 months with labs before  Jessica K. [place], [facility] [phone]

## 2019-01-14 DIAGNOSIS — H401111 Primary open-angle glaucoma, right eye, mild stage: Secondary | ICD-10-CM | POA: Diagnosis not present

## 2019-02-02 DIAGNOSIS — Z20828 Contact with and (suspected) exposure to other viral communicable diseases: Secondary | ICD-10-CM | POA: Diagnosis not present

## 2019-04-19 DIAGNOSIS — H5501 Congenital nystagmus: Secondary | ICD-10-CM | POA: Diagnosis not present

## 2019-04-19 DIAGNOSIS — H2701 Aphakia, right eye: Secondary | ICD-10-CM | POA: Diagnosis not present

## 2019-04-19 DIAGNOSIS — H25812 Combined forms of age-related cataract, left eye: Secondary | ICD-10-CM | POA: Diagnosis not present

## 2019-04-19 DIAGNOSIS — H401111 Primary open-angle glaucoma, right eye, mild stage: Secondary | ICD-10-CM | POA: Diagnosis not present

## 2019-04-20 ENCOUNTER — Ambulatory Visit: Payer: BLUE CROSS/BLUE SHIELD | Attending: Internal Medicine

## 2019-04-20 ENCOUNTER — Other Ambulatory Visit: Payer: Self-pay

## 2019-04-20 DIAGNOSIS — Z20828 Contact with and (suspected) exposure to other viral communicable diseases: Secondary | ICD-10-CM | POA: Diagnosis not present

## 2019-04-20 DIAGNOSIS — Z23 Encounter for immunization: Secondary | ICD-10-CM

## 2019-04-20 NOTE — Progress Notes (Signed)
   Covid-19 Vaccination Clinic  Name:  Brenda Gonzales    MRN: [id number] DOB: [birth year]  04/20/2019  [name] was observed post Covid-19 immunization for 15 minutes without incidence. She was provided with Vaccine Information Sheet and instruction to access the V-Safe system.   [name] was instructed to call 911 with any severe reactions post vaccine: Marland Kitchen Difficulty breathing  . Swelling of your face and throat  . A fast heartbeat  . A bad rash all over your body  . Dizziness and weakness    Immunizations Administered    Name Date Dose VIS Date Route   Moderna COVID-19 Vaccine 04/20/2019 12:18 AM 0.5 mL 02/02/2019 Intramuscular   Manufacturer: Moderna   Lot: [id number]   [id number]

## 2019-05-07 DIAGNOSIS — Z20828 Contact with and (suspected) exposure to other viral communicable diseases: Secondary | ICD-10-CM | POA: Diagnosis not present

## 2019-05-14 DIAGNOSIS — Z20828 Contact with and (suspected) exposure to other viral communicable diseases: Secondary | ICD-10-CM | POA: Diagnosis not present

## 2019-05-19 ENCOUNTER — Ambulatory Visit: Payer: Self-pay | Attending: Internal Medicine

## 2019-05-19 DIAGNOSIS — Z23 Encounter for immunization: Secondary | ICD-10-CM

## 2019-05-19 NOTE — Progress Notes (Signed)
   Covid-19 Vaccination Clinic  Name:  Brenda Gonzales    MRN: [id number] DOB: 1965-11-30  05/19/2019  Brenda Gonzales was observed post Covid-19 immunization for 15 minutes without incident. She was provided with Vaccine Information Sheet and instruction to access the V-Safe system.   Brenda Gonzales was instructed to call 911 with any severe reactions post vaccine: Marland Kitchen Difficulty breathing  . Swelling of face and throat  . A fast heartbeat  . A bad rash all over body  . Dizziness and weakness   Immunizations Administered    Name Date Dose VIS Date Route   Moderna COVID-19 Vaccine 05/19/2019 12:13 PM 0.5 mL 02/02/2019 Intramuscular   Manufacturer: Moderna   Lot: [id number]   [id number]

## 2019-05-28 ENCOUNTER — Ambulatory Visit: Payer: BC Managed Care – PPO | Admitting: Nurse Practitioner

## 2019-06-23 ENCOUNTER — Encounter: Payer: Self-pay | Admitting: Nurse Practitioner

## 2019-06-23 ENCOUNTER — Other Ambulatory Visit: Payer: Self-pay

## 2019-06-23 ENCOUNTER — Ambulatory Visit (INDEPENDENT_AMBULATORY_CARE_PROVIDER_SITE_OTHER): Payer: BC Managed Care – PPO | Admitting: Nurse Practitioner

## 2019-06-23 VITALS — BP 134/82 | HR 73 | Temp 98.6°F | Resp 16 | Ht 66.0 in | Wt 222.2 lb

## 2019-06-23 DIAGNOSIS — Z1231 Encounter for screening mammogram for malignant neoplasm of breast: Secondary | ICD-10-CM | POA: Diagnosis not present

## 2019-06-23 DIAGNOSIS — D709 Neutropenia, unspecified: Secondary | ICD-10-CM | POA: Insufficient documentation

## 2019-06-23 DIAGNOSIS — E669 Obesity, unspecified: Secondary | ICD-10-CM | POA: Diagnosis not present

## 2019-06-23 DIAGNOSIS — E782 Mixed hyperlipidemia: Secondary | ICD-10-CM

## 2019-06-23 LAB — COMPLETE METABOLIC PANEL WITH GFR
AG Ratio: 1.7 (calc) (ref 1.0–2.5)
ALT: 12 U/L (ref 6–29)
AST: 16 U/L (ref 10–35)
Albumin: 3.8 g/dL (ref 3.6–5.1)
Alkaline phosphatase (APISO): 49 U/L (ref 37–153)
BUN: 9 mg/dL (ref 7–25)
CO2: 30 mmol/L (ref 20–32)
Calcium: 9.3 mg/dL (ref 8.6–10.4)
Chloride: 106 mmol/L (ref 98–110)
Creat: 0.66 mg/dL (ref 0.50–1.05)
GFR, Est African American: 116 mL/min/{1.73_m2} (ref >=60)
GFR, Est Non African American: 100 mL/min/{1.73_m2} (ref >=60)
Globulin: 2.2 g/dL (calc) (ref 1.9–3.7)
Glucose, Bld: 82 mg/dL (ref 65–99)
Potassium: 4.2 mmol/L (ref 3.5–5.3)
Sodium: 142 mmol/L (ref 135–146)
Total Bilirubin: 0.4 mg/dL (ref 0.2–1.2)
Total Protein: 6 g/dL — ABNORMAL LOW (ref 6.1–8.1)

## 2019-06-23 LAB — CBC WITH DIFFERENTIAL/PLATELET
Absolute Monocytes: 357 cells/uL (ref 200–950)
Basophils Absolute: 42 cells/uL (ref 0–200)
Basophils Relative: 1.4 %
Eosinophils Absolute: 111 cells/uL (ref 15–500)
Eosinophils Relative: 3.7 %
HCT: 39.4 % (ref 35.0–45.0)
Hemoglobin: 12.6 g/dL (ref 11.7–15.5)
Lymphs Abs: 1332 cells/uL (ref 850–3900)
MCH: 28 pg (ref 27.0–33.0)
MCHC: 32 g/dL (ref 32.0–36.0)
MCV: 87.6 fL (ref 80.0–100.0)
MPV: 10.2 fL (ref 7.5–12.5)
Monocytes Relative: 11.9 %
Neutro Abs: 1158 cells/uL — ABNORMAL LOW (ref 1500–7800)
Neutrophils Relative %: 38.6 %
Platelets: 223 10*3/uL (ref 140–400)
RBC: 4.5 10*6/uL (ref 3.80–5.10)
RDW: 12.7 % (ref 11.0–15.0)
Total Lymphocyte: 44.4 %
WBC: 3 10*3/uL — ABNORMAL LOW (ref 3.8–10.8)

## 2019-06-23 LAB — LIPID PANEL
Cholesterol: 225 mg/dL — ABNORMAL HIGH (ref <200)
HDL: 81 mg/dL (ref >=50)
LDL Cholesterol (Calc): 130 mg/dL (calc) — ABNORMAL HIGH
Non-HDL Cholesterol (Calc): 144 mg/dL (calc) — ABNORMAL HIGH (ref <130)
Total CHOL/HDL Ratio: 2.8 (calc) (ref <5.0)
Triglycerides: 58 mg/dL (ref <150)

## 2019-06-23 NOTE — Patient Instructions (Signed)
Increase physical activity- 30 mins 5 days a week   Fat and Cholesterol Restricted Eating Plan Getting too much fat and cholesterol in your diet may cause health problems. Choosing the right foods helps keep your fat and cholesterol at normal levels. This can keep you from getting certain diseases. Your doctor may recommend an eating plan that includes:  Total fat: ______% or less of total calories a day.  Saturated fat: ______% or less of total calories a day.  Cholesterol: less than _________mg a day.  Fiber: ______g a day. What are tips for following this plan? Meal planning  At meals, divide your plate into four equal parts: ? Fill one-half of your plate with vegetables and green salads. ? Fill one-fourth of your plate with whole grains. ? Fill one-fourth of your plate with low-fat (lean) protein foods.  Eat fish that is high in omega-3 fats at least two times a week. This includes mackerel, tuna, sardines, and salmon.  Eat foods that are high in fiber, such as whole grains, beans, apples, broccoli, carrots, peas, and barley. General tips   Work with your doctor to lose weight if you need to.  Avoid: ? Foods with added sugar. ? Fried foods. ? Foods with partially hydrogenated oils.  Limit alcohol intake to no more than 1 drink a day for nonpregnant women and 2 drinks a day for men. One drink equals 12 oz of beer, 5 oz of wine, or 1 oz of hard liquor. Reading food labels  Check food labels for: ? Trans fats. ? Partially hydrogenated oils. ? Saturated fat (g) in each serving. ? Cholesterol (mg) in each serving. ? Fiber (g) in each serving.  Choose foods with healthy fats, such as: ? Monounsaturated fats. ? Polyunsaturated fats. ? Omega-3 fats.  Choose grain products that have whole grains. Look for the word "whole" as the first word in the ingredient list. Cooking  Cook foods using low-fat methods. These include baking, boiling, grilling, and broiling.  Eat  more home-cooked foods. Eat at restaurants and buffets less often.  Avoid cooking using saturated fats, such as butter, cream, palm oil, palm kernel oil, and coconut oil. Recommended foods  Fruits  All fresh, canned (in natural juice), or frozen fruits. Vegetables  Fresh or frozen vegetables (raw, steamed, roasted, or grilled). Green salads. Grains  Whole grains, such as whole wheat or whole grain breads, crackers, cereals, and pasta. Unsweetened oatmeal, bulgur, barley, quinoa, or brown rice. Corn or whole wheat flour tortillas. Meats and other protein foods  Ground beef (85% or leaner), grass-fed beef, or beef trimmed of fat. Skinless chicken or [place]. Ground chicken or [place]. Pork trimmed of fat. All fish and seafood. Egg whites. Dried beans, peas, or lentils. Unsalted nuts or seeds. Unsalted canned beans. Nut butters without added sugar or oil. Dairy  Low-fat or nonfat dairy products, such as skim or 1% milk, 2% or reduced-fat cheeses, low-fat and fat-free ricotta or cottage cheese, or plain low-fat and nonfat yogurt. Fats and oils  Tub margarine without trans fats. Light or reduced-fat mayonnaise and salad dressings. Avocado. Olive, canola, sesame, or safflower oils. The items listed above may not be a complete list of foods and beverages you can eat. Contact a dietitian for more information. Foods to avoid Fruits  Canned fruit in heavy syrup. Fruit in cream or butter sauce. Fried fruit. Vegetables  Vegetables cooked in cheese, cream, or butter sauce. Fried vegetables. Grains  White bread. White pasta. White rice. Cornbread. Bagels,  pastries, and croissants. Crackers and snack foods that contain trans fat and hydrogenated oils. Meats and other protein foods  Fatty cuts of meat. Ribs, chicken wings, bacon, sausage, bologna, salami, chitterlings, fatback, hot dogs, bratwurst, and packaged lunch meats. Liver and organ meats. Whole eggs and egg yolks. Chicken and [place] with  skin. Fried meat. Dairy  Whole or 2% milk, cream, half-and-half, and cream cheese. Whole milk cheeses. Whole-fat or sweetened yogurt. Full-fat cheeses. Nondairy creamers and whipped toppings. Processed cheese, cheese spreads, and cheese curds. Beverages  Alcohol. Sugar-sweetened drinks such as sodas, lemonade, and fruit drinks. Fats and oils  Butter, stick margarine, lard, shortening, ghee, or bacon fat. Coconut, palm kernel, and palm oils. Sweets and desserts  Corn syrup, sugars, honey, and molasses. Candy. Jam and jelly. Syrup. Sweetened cereals. Cookies, pies, cakes, donuts, muffins, and ice cream. The items listed above may not be a complete list of foods and beverages you should avoid. Contact a dietitian for more information. Summary  Choosing the right foods helps keep your fat and cholesterol at normal levels. This can keep you from getting certain diseases.  At meals, fill one-half of your plate with vegetables and green salads.  Eat high-fiber foods, like whole grains, beans, apples, carrots, peas, and barley.  Limit added sugar, saturated fats, alcohol, and fried foods. This information is not intended to replace advice given to you by your health care provider. Make sure you discuss any questions you have with your health care provider. Document Revised: 10/22/2017 Document Reviewed: 11/05/2016 Elsevier Patient Education  [place].

## 2019-06-23 NOTE — Progress Notes (Signed)
Careteam: Patient Care Team: Lauree Chandler, NP as PCP - General (Geriatric Medicine) Everlene Farrier, MD as Consulting Physician (Obstetrics and Gynecology) Rutherford Guys, MD as Consulting Physician (Ophthalmology)  PLACE OF SERVICE:  [facility] Directive information Does Patient Have a Medical Advance Directive?: No  Allergies  Allergen Reactions  . Influenza Vaccines Other (See Comments)    Left side of body went numb x several days   . Benzoyl Peroxide Swelling    Chief Complaint  Patient presents with  . Follow-up    6 Month Follow Up  . Health Maintenance    Discuss the need for Mammogram and HIV Screening.     HPI: Patient is a 54 y.o. female for routine follow up seen in office today.  Fasting today for blood work. Has gained weight since last OV.  Not doing any exercise- on her feet all day at work.  Had both COVID vaccines- hesitant to go back to the gym.  Eating habits are not the greatest.  Has tired the nutritionist before- Reports she is a compulsive eater before Eats when she is not hungry- ?stress eating.  Not as active with activities   Plans to see dentist today.   Review of Systems:  Review of Systems  Constitutional: Negative for chills, fever and weight loss.  HENT: Negative for congestion.   Eyes:       Blindness  Respiratory: Negative for cough and shortness of breath.   Cardiovascular: Negative for chest pain, palpitations and leg swelling.  Gastrointestinal: Negative for constipation, diarrhea and heartburn.  Genitourinary: Negative for dysuria, frequency and urgency.  Musculoskeletal: Negative for back pain, joint pain and myalgias.  Skin: Negative.   Neurological: Negative for dizziness and headaches.  Endo/Heme/Allergies: Negative for environmental allergies.  Psychiatric/Behavioral: Negative for depression. The patient is not nervous/anxious and does not have insomnia.     Past Medical History:  Diagnosis Date    . Anemia   . Arthritis   . Cataract   . Glaucoma   . Hx of adenomatous polyp of colon 09/17/2017  . Hyperlipidemia    Past Surgical History:  Procedure Laterality Date  . ABDOMINOPLASTY  2009  . EYE SURGERY    . TUBAL LIGATION  2007   Social History:   reports that she has never smoked. She has never used smokeless tobacco. She reports current alcohol use. She reports that she does not use drugs.  Family History  Problem Relation Age of Onset  . Hypertension Mother   . Hyperlipidemia Mother   . Diabetes Father 9  . Hypertension Father   . Cancer Father   . Prostate cancer Father 59  . Colon cancer Neg Hx   . Esophageal cancer Neg Hx   . Liver cancer Neg Hx   . Pancreatic cancer Neg Hx   . Rectal cancer Neg Hx   . Stomach cancer Neg Hx     Medications: Patient's Medications  New Prescriptions   No medications on file  Previous Medications   BRIMONIDINE (ALPHAGAN) 0.15 % OPHTHALMIC SOLUTION    1 drop in right eye twice daily   LATANOPROST (XALATAN) 0.005 % OPHTHALMIC SOLUTION    Place 1 drop into the right eye at bedtime.   MULTIPLE VITAMINS-MINERALS (MULTIVITAMIN WOMEN PO)    Take by mouth.   PRAVASTATIN (PRAVACHOL) 20 MG TABLET    TAKE 1 TABLET BY MOUTH EVERY DAY  Modified Medications   No medications on file  Discontinued Medications  No medications on file    Physical Exam:  Vitals:   06/23/19 0817  BP: 134/82  Pulse: 73  Resp: 16  Temp: 98.6 F (37 C)  SpO2: 98%  Weight: 222 lb 3.2 oz (100.8 kg)  Height: 5\' 6"  (1.676 m)   Body mass index is 35.86 kg/m. Wt Readings from Last 3 Encounters:  06/23/19 222 lb 3.2 oz (100.8 kg)  11/25/18 212 lb 3.2 oz (96.3 kg)  04/20/18 216 lb 6.4 oz (98.2 kg)    Physical Exam Constitutional:      General: She is not in acute distress.    Appearance: She is well-developed. She is not diaphoretic.  HENT:     Head: Normocephalic and atraumatic.     Mouth/Throat:     Pharynx: No oropharyngeal exudate.  Eyes:      Conjunctiva/sclera: Conjunctivae normal.     Pupils: Pupils are equal, round, and reactive to light.  Cardiovascular:     Rate and Rhythm: Normal rate and regular rhythm.     Heart sounds: Normal heart sounds.  Pulmonary:     Effort: Pulmonary effort is normal.     Breath sounds: Normal breath sounds.  Abdominal:     General: Bowel sounds are normal.     Palpations: Abdomen is soft.  Musculoskeletal:        General: No tenderness.     Cervical back: Normal range of motion and neck supple.  Skin:    General: Skin is warm and dry.  Neurological:     Mental Status: She is alert and oriented to person, place, and time.     Labs reviewed: Basic Metabolic Panel: Recent Labs    11/03/18 0807  NA 142  K 4.2  CL 106  CO2 31  GLUCOSE 96  BUN 14  CREATININE 0.74  CALCIUM 9.3   Liver Function Tests: Recent Labs    11/03/18 0807  AST 16  ALT 9  BILITOT 0.4  PROT 5.8*   No results for input(s): LIPASE, AMYLASE in the last 8760 hours. No results for input(s): AMMONIA in the last 8760 hours. CBC: Recent Labs    11/03/18 0807  WBC 3.3*  NEUTROABS 1,544  HGB 12.5  HCT 37.7  MCV 88.3  PLT 235   Lipid Panel: Recent Labs    11/03/18 0807  CHOL 239*  HDL 80  LDLCALC 145*  TRIG 46  CHOLHDL 3.0   TSH: No results for input(s): TSH in the last 8760 hours. A1C: Lab Results  Component Value Date   HGBA1C 5.3 12/19/2017     Assessment/Plan 1. Neutropenia, unspecified type ([place]) -stable on last labs, will follow up - CBC with Differential/Platelet  2. Obesity (BMI 35.0-39.9 without comorbidity) -reports she is a compulsive eaters. Discussed need for changing her eating habits. She is eating without being hungry. Not exercising or as active as she has been in the past.  -discussed lifestyle modifications and changes to her mindset.  -discussed counseling.  - Amb Ref to Medical Weight Management  3. Mixed hyperlipidemia -discussed dietary  modifications -continues on pravastatin at this time.  - Lipid panel - COMPLETE METABOLIC PANEL WITH GFR  4. Encounter for screening mammogram for malignant neoplasm of breast -previously done at GYN but has not followed up recently due to [place]; Future  Next appt: 6 months, sooner if needed  Pranathi Winfree K. [place], [facility] [phone]

## 2019-07-13 ENCOUNTER — Other Ambulatory Visit: Payer: Self-pay

## 2019-07-13 ENCOUNTER — Ambulatory Visit
Admission: RE | Admit: 2019-07-13 | Discharge: 2019-07-13 | Disposition: A | Discharge status: Home / Self Care | Payer: BC Managed Care – PPO | Source: Ambulatory Visit | Attending: Nurse Practitioner | Admitting: Nurse Practitioner

## 2019-07-13 DIAGNOSIS — Z1231 Encounter for screening mammogram for malignant neoplasm of breast: Secondary | ICD-10-CM | POA: Diagnosis not present

## 2019-08-12 ENCOUNTER — Encounter (INDEPENDENT_AMBULATORY_CARE_PROVIDER_SITE_OTHER): Payer: Self-pay | Admitting: Family Medicine

## 2019-08-12 ENCOUNTER — Ambulatory Visit (INDEPENDENT_AMBULATORY_CARE_PROVIDER_SITE_OTHER): Payer: BC Managed Care – PPO | Admitting: Family Medicine

## 2019-08-12 ENCOUNTER — Other Ambulatory Visit: Payer: Self-pay

## 2019-08-12 VITALS — BP 139/69 | HR 71 | Temp 97.9°F | Ht 66.0 in | Wt 216.0 lb

## 2019-08-12 DIAGNOSIS — E559 Vitamin D deficiency, unspecified: Secondary | ICD-10-CM

## 2019-08-12 DIAGNOSIS — R5383 Other fatigue: Secondary | ICD-10-CM | POA: Diagnosis not present

## 2019-08-12 DIAGNOSIS — R0602 Shortness of breath: Secondary | ICD-10-CM | POA: Diagnosis not present

## 2019-08-12 DIAGNOSIS — Z6835 Body mass index (BMI) 35.0-35.9, adult: Secondary | ICD-10-CM

## 2019-08-12 DIAGNOSIS — Z9189 Other specified personal risk factors, not elsewhere classified: Secondary | ICD-10-CM

## 2019-08-12 DIAGNOSIS — E7849 Other hyperlipidemia: Secondary | ICD-10-CM

## 2019-08-12 DIAGNOSIS — F3289 Other specified depressive episodes: Secondary | ICD-10-CM

## 2019-08-12 DIAGNOSIS — D708 Other neutropenia: Secondary | ICD-10-CM

## 2019-08-12 DIAGNOSIS — Z0289 Encounter for other administrative examinations: Secondary | ICD-10-CM

## 2019-08-12 NOTE — Progress Notes (Signed)
Office: [phone]  /  Fax: [phone]    Date: August 19, 2019   Appointment Start Time: 11:46am Duration: 48 minutes Provider: Glennie Isle, Psy.D. Type of Session: Intake for Individual Therapy  Location of Patient: Home Location of Provider: Provider's Home Type of Contact: Telepsychological Visit via MyChart Video Visit  Informed Consent: Prior to proceeding with today's appointment, two pieces of identifying information were obtained. In addition, Brenda Gonzales's physical location at the time of this appointment was obtained as well a phone number she could be reached at in the event of technical difficulties. [name] and this provider participated in today's telepsychological service.   The provider's role was explained to [name]. The provider reviewed and discussed issues of confidentiality, privacy, and limits therein (e.g., reporting obligations). In addition to verbal informed consent, written informed consent for psychological services was obtained prior to the initial appointment. Since the clinic is not a 24/7 crisis center, mental health emergency resources were shared and this  provider explained MyChart, e-mail, voicemail, and/or other messaging systems should be utilized only for non-emergency reasons. This provider also explained that information obtained during appointments will be placed in [name]'s medical record and relevant information will be shared with other providers at [facility] for coordination of care. Moreover, [name] agreed information may be shared with other [facility] providers as needed for coordination of care. By signing the service agreement document, [name] provided written consent for coordination of care. Prior to initiating telepsychological services, [name] completed an informed consent document, which included the development of a safety plan (i.e., an emergency contact, nearest emergency room, and emergency  resources) in the event of an emergency/crisis. [name] expressed understanding of the rationale of the safety plan. [name] verbally acknowledged understanding she is ultimately responsible for understanding her insurance benefits for telepsychological and in-person services. This provider also reviewed confidentiality, as it relates to telepsychological services, as well as the rationale for telepsychological services (i.e., to reduce exposure risk to COVID-19). [name]  acknowledged understanding that appointments cannot be recorded without both party consent and she is aware she is responsible for securing confidentiality on her end of the session. [name] verbally consented to proceed.  Chief Complaint/HPI: Brenda Gonzales was referred by Dr. Dennard Nip due to other depression, with emotional eating. Per the note for the initial visit with Dr. Dennard Nip on August 12, 2019, "Brenda Gonzales describes herself as a "compulsive eater". She lives with her mother and notes it is a challenge to eat healthy with her there. She notes emotional eating." The note for the initial appointment with Dr. Dennard Nip indicated the following: "Brenda Gonzales's habits were reviewed today and are as follows: Her family eats meals together, her desired weight loss is 41 lbs, she has been heavy most of her life, she started gaining weight at 54 years old, her heaviest weight ever was 312 pounds, she has significant food cravings issues, she snacks frequently in the evenings, she is frequently drinking liquids with calories, she frequently makes poor food choices, she has problems with excessive hunger, she frequently eats larger portions than normal, she has binge eating behaviors and she struggles with emotional eating." Luther's Food and Mood (modified PHQ-9) score on August 12, 2019 was 3.  During today's appointment, Brenda Gonzales reported the current meal plan is "stressful" due to lack of sugar and snacks; however, she is working to eat all food  daily. She was verbally administered a questionnaire assessing various behaviors related to emotional eating. Brenda Gonzales  endorsed the following: experience food cravings on a regular basis, eat certain foods when you are anxious, stressed, depressed, or your feelings are hurt, use food to help you cope with emotional situations, find food is comforting to you, overeat when you are worried about something, overeat frequently when you are bored or lonely, not worry about what you eat when you are in a good mood, overeat when you are alone, but eat much less when you are with other people, eat to help you stay awake and eat as a reward. She shared she craves "anything sugary." Brenda Gonzales believes the onset of emotional eating was likely in childhood and described the current frequency of emotional eating as "daily." In addition, Brenda Gonzales endorsed a history of binge eating, noting she will eat bags of cookies/snacks in their entirety during one sitting. Brenda Gonzales described the frequency of the aforementioned as twice a week, typically on the weekends. Brenda Gonzales denied a history of restricting food intake, purging and engagement in other compensatory strategies, and has never been diagnosed with an eating disorder. She also denied a history of treatment for emotional eating. Moreover, Brenda Gonzales indicated boredom on the weekends and stress and anxiety on the weekdays triggers emotional eating, whereas having something to do makes emotional eating better. She noted she can "eat, eat, eat, and eat" and she will stop eating due to feeling tired of chewing. Furthermore, Brenda Gonzales denied other problems of concern.    Mental Status Examination:  Appearance: well groomed and appropriate hygiene  Behavior: appropriate to circumstances Mood: euthymic Affect: mood congruent Speech: normal in rate, volume, and tone Eye Contact: appropriate Psychomotor Activity: appropriate Gait: unable to assess Thought Process: linear, logical, and  goal directed  Thought Content/Perception: denies suicidal and homicidal ideation, plan, and intent and no hallucinations, delusions, bizarre thinking or behavior reported or observed Orientation: time, person, place, and purpose of appointment Memory/Concentration: memory, attention, language, and fund of knowledge intact  Insight/Judgment: good  Family & Psychosocial History: Melonee reported she is not in a relationship and she does not have any children. She indicated she is currently employed with a [employer] in [place]. Additionally, Marcos shared her highest level of education obtained is an associate's degree. Currently, Jaimee's social support system consists of her few best friends, few bowling mates, and co-workers. Moreover, Janaya stated she resides with her mother, adding, "I'm legally blind."   Medical History:  Past Medical History:  Diagnosis Date  . Anemia   . Arthritis   . Back pain   . Cataract   . Constipation   . Food allergy   . Glaucoma   . Hx of adenomatous polyp of colon 09/17/2017  . Hyperlipidemia   . Joint pain   . Obesity   . Swelling of lower extremity   . Vitamin D deficiency    Past Surgical History:  Procedure Laterality Date  . ABDOMINOPLASTY  2009  . CATARACT EXTRACTION    . EYE SURGERY    . TUBAL LIGATION  2007  . VARICOSE VEIN SURGERY     Current Outpatient Medications on File Prior to Visit  Medication Sig Dispense Refill  . brimonidine (ALPHAGAN) 0.15 % ophthalmic solution 1 drop in right eye twice daily  2  . latanoprost (XALATAN) 0.005 % ophthalmic solution Place 1 drop into the right eye at bedtime.    . Multiple Vitamins-Minerals (MULTIVITAMIN WOMEN PO) Take by mouth.    Marland Kitchen OVER THE COUNTER MEDICATION 1 each daily. Goli Apple Cider Gummies    .  pravastatin (PRAVACHOL) 20 MG tablet TAKE 1 TABLET BY MOUTH EVERY DAY 90 tablet 1   Current Facility-Administered Medications on File Prior to Visit  Medication Dose  Route Frequency Provider Last Rate Last Admin  . 0.9 %  sodium chloride infusion  500 mL Intravenous Once Gatha Mayer, MD       Satina denied a history of head injuries and loss of consciousness.    Mental Health History: Vondra denied a history of therapeutic and psychiatric services. Ariannah reported there is no history of hospitalizations for psychiatric concerns, and she has never met with a psychiatrist. Asal denied a family history of mental health related concerns. Leomia reported there is no history of trauma including psychological, physical  and sexual abuse, as well as neglect.   Aury described her typical mood lately as "happy, go lucky," noting there is "typical work place stress." Jericka endorsed social alcohol use, noting it has reduced due to the pandemic. She denied tobacco use. She denied illicit/recreational substance use. Regarding caffeine intake, Teosha reported consuming two cups of coffee daily. Furthermore, Annabell indicated she is not experiencing the following: hallucinations and delusions, paranoia, symptoms of mania , social withdrawal, crying spells, panic attacks and decreased motivation. She also denied history of and current suicidal ideation, plan, and intent; history of and current homicidal ideation, plan, and intent; and history of and current engagement in self-harm.  The following strengths were reported by Brenda Gonzales: optimistic and pretty upbeat. The following strengths were observed by this provider: ability to express thoughts and feelings during the therapeutic session, ability to establish and benefit from a therapeutic relationship, willingness to work toward established goal(s) with the clinic and ability to engage in reciprocal conversation.   Legal History: Jesenya reported there is no history of legal involvement.   Structured Assessments Results: The Patient Health Questionnaire-9 (PHQ-9) is a self-report measure that assesses symptoms and  severity of depression over the course of the last two weeks. Sherryann obtained a score of 2 suggesting minimal depression. Candita finds the endorsed symptoms to be not difficult at all. [0= Not at all; 1= Several days; 2= More than half the days; 3= Nearly every day] Little interest or pleasure in doing things 0  Feeling down, depressed, or hopeless 0  Trouble falling or staying asleep, or sleeping too much 2  Feeling tired or having little energy 0  Poor appetite or overeating 0  Feeling bad about yourself --- or that you are a failure or have let yourself or your family down 0  Trouble concentrating on things, such as reading the newspaper or watching television 0  Moving or speaking so slowly that other people could have noticed? Or the opposite --- being so fidgety or restless that you have been moving around a lot more than usual 0  Thoughts that you would be better off dead or hurting yourself in some way 0  PHQ-9 Score 2    The Generalized Anxiety Disorder-7 (GAD-7) is a brief self-report measure that assesses symptoms of anxiety over the course of the last two weeks. Nicholette obtained a score of 0. [0= Not at all; 1= Several days; 2= Over half the days; 3= Nearly every day] Feeling nervous, anxious, on edge 0  Not being able to stop or control worrying 0  Worrying too much about different things 0  Trouble relaxing 0  Being so restless that it's hard to sit still 0  Becoming easily annoyed or irritable 0  Feeling afraid  as if something awful might happen 0  GAD-7 Score 0   Interventions:  Conducted a chart review Focused on rapport building Verbally administered PHQ-9 and GAD-7 for symptom monitoring Verbally administered Food & Mood questionnaire to assess various behaviors related to emotional eating Provided emphatic reflections and validation Collaborated with patient on a treatment goal  Psychoeducation provided regarding physical versus emotional hunger  Provisional  DSM-5 Diagnosis(es): 307.59 (F50.8) Other Specified Feeding or Eating Disorder, Emotional Eating and Binge Eating Behaviors  Plan: Avia appears able and willing to participate as evidenced by collaboration on a treatment goal, engagement in reciprocal conversation, and asking questions as needed for clarification. The next appointment will be scheduled in approximately two weeks, which will be via MyChart Video Visit. The following treatment goal was established: increase coping skills. This provider will regularly review the treatment plan and medical chart to keep informed of status changes. Meosha expressed understanding and agreement with the initial treatment plan of care. Ayauna will be sent a handout via e-mail to utilize between now and the next appointment to increase awareness of hunger patterns and subsequent eating. Xzandria provided verbal consent during today's appointment for this provider to send the handout via e-mail.

## 2019-08-13 LAB — TSH: TSH: 0.958 u[IU]/mL (ref 0.450–4.500)

## 2019-08-13 LAB — LIPID PANEL WITH LDL/HDL RATIO
Cholesterol, Total: 238 mg/dL — ABNORMAL HIGH (ref 100–199)
HDL: 96 mg/dL (ref >39)
LDL Chol Calc (NIH): 133 mg/dL — ABNORMAL HIGH (ref 0–99)
LDL/HDL Ratio: 1.4 ratio (ref 0.0–3.2)
Triglycerides: 54 mg/dL (ref 0–149)
VLDL Cholesterol Cal: 9 mg/dL (ref 5–40)

## 2019-08-13 LAB — CBC WITH DIFFERENTIAL/PLATELET
Basophils Absolute: 0 10*3/uL (ref 0.0–0.2)
Basos: 1 %
EOS (ABSOLUTE): 0.1 10*3/uL (ref 0.0–0.4)
Eos: 2 %
Hematocrit: 38 % (ref 34.0–46.6)
Hemoglobin: 12.5 g/dL (ref 11.1–15.9)
Immature Grans (Abs): 0 10*3/uL (ref 0.0–0.1)
Immature Granulocytes: 0 %
Lymphocytes Absolute: 1.4 10*3/uL (ref 0.7–3.1)
Lymphs: 42 %
MCH: 29.1 pg (ref 26.6–33.0)
MCHC: 32.9 g/dL (ref 31.5–35.7)
MCV: 89 fL (ref 79–97)
Monocytes Absolute: 0.4 10*3/uL (ref 0.1–0.9)
Monocytes: 12 %
Neutrophils Absolute: 1.4 10*3/uL (ref 1.4–7.0)
Neutrophils: 43 %
Platelets: 245 10*3/uL (ref 150–450)
RBC: 4.29 x10E6/uL (ref 3.77–5.28)
RDW: 13 % (ref 11.7–15.4)
WBC: 3.2 10*3/uL — ABNORMAL LOW (ref 3.4–10.8)

## 2019-08-13 LAB — VITAMIN B12: Vitamin B-12: 733 pg/mL (ref 232–1245)

## 2019-08-13 LAB — COMPREHENSIVE METABOLIC PANEL
ALT: 12 IU/L (ref 0–32)
AST: 21 IU/L (ref 0–40)
Albumin/Globulin Ratio: 2.1 (ref 1.2–2.2)
Albumin: 4.2 g/dL (ref 3.8–4.9)
Alkaline Phosphatase: 57 IU/L (ref 48–121)
BUN/Creatinine Ratio: 15 (ref 9–23)
BUN: 10 mg/dL (ref 6–24)
Bilirubin Total: 0.4 mg/dL (ref 0.0–1.2)
CO2: 27 mmol/L (ref 20–29)
Calcium: 9.4 mg/dL (ref 8.7–10.2)
Chloride: 102 mmol/L (ref 96–106)
Creatinine, Ser: 0.68 mg/dL (ref 0.57–1.00)
GFR calc Af Amer: 115 mL/min/{1.73_m2} (ref >59)
GFR calc non Af Amer: 99 mL/min/{1.73_m2} (ref >59)
Globulin, Total: 2 g/dL (ref 1.5–4.5)
Glucose: 85 mg/dL (ref 65–99)
Potassium: 4.1 mmol/L (ref 3.5–5.2)
Sodium: 140 mmol/L (ref 134–144)
Total Protein: 6.2 g/dL (ref 6.0–8.5)

## 2019-08-13 LAB — T4, FREE: Free T4: 1.05 ng/dL (ref 0.82–1.77)

## 2019-08-13 LAB — FOLATE: Folate: 14.4 ng/mL (ref >3.0)

## 2019-08-13 LAB — VITAMIN D 25 HYDROXY (VIT D DEFICIENCY, FRACTURES): Vit D, 25-Hydroxy: 29.2 ng/mL — ABNORMAL LOW (ref 30.0–100.0)

## 2019-08-13 LAB — INSULIN, RANDOM: INSULIN: 4.2 u[IU]/mL (ref 2.6–24.9)

## 2019-08-13 LAB — HEMOGLOBIN A1C
Est. average glucose Bld gHb Est-mCnc: 108 mg/dL
Hgb A1c MFr Bld: 5.4 % (ref 4.8–5.6)

## 2019-08-13 LAB — T3: T3, Total: 103 ng/dL (ref 71–180)

## 2019-08-18 NOTE — Progress Notes (Signed)
Chief Complaint:   OBESITY Brenda Gonzales (MR# [id number]) is a 54 y.o. female who presents for evaluation and treatment of obesity and related comorbidities. Current BMI is Body mass index is 34.86 kg/m. Jaquanda has been struggling with her weight for many years and has been unsuccessful in either losing weight, maintaining weight loss, or reaching her healthy weight goal.  Gwenette is currently in the action stage of change and ready to dedicate time achieving and maintaining a healthier weight. Malayiah is interested in becoming our patient and working on intensive lifestyle modifications including (but not limited to) diet and exercise for weight loss.  Leesha's habits were reviewed today and are as follows: Her family eats meals together, her desired weight loss is 41 lbs, she has been heavy most of her life, she started gaining weight at 54 years old, her heaviest weight ever was 312 pounds, she has significant food cravings issues, she snacks frequently in the evenings, she is frequently drinking liquids with calories, she frequently makes poor food choices, she has problems with excessive hunger, she frequently eats larger portions than normal, she has binge eating behaviors and she struggles with emotional eating.  Depression Screen Adalynne's Food and Mood (modified PHQ-9) score was 3.  Depression screen [facility] 2/9 08/12/2019  Decreased Interest 0  Down, Depressed, Hopeless 0  PHQ - 2 Score 0  Altered sleeping 0  Tired, decreased energy 1  Change in appetite 1  Feeling bad or failure about yourself  1  Trouble concentrating 0  Moving slowly or fidgety/restless 0  Suicidal thoughts 0  PHQ-9 Score 3  Difficult doing work/chores Not difficult at all   Subjective:   1. Other fatigue Roshell admits to daytime somnolence and denies waking up still tired. Patent has a history of symptoms of daytime fatigue. Nisa generally gets 6 hours of sleep per night, and states that she has  difficulty falling asleep. Snoring is not present. Apneic episodes are not present. Epworth Sleepiness Score is 3.  2. Shortness of breath on exertion Katerina notes increasing shortness of breath with exercising and seems to be worsening over time with weight gain. She notes getting out of breath sooner with activity than she used to. This has not gotten worse recently. Yamira denies shortness of breath at rest or orthopnea.  3. Other hyperlipidemia Ia is on pravastatin currently and she denies complaints or drug side effect concerns.  4. Other neutropenia (HCC) Averil has a long standing history per her chart.  5. Vitamin D deficiency Latanza has a history of being on OTC supplementation of Vit D. She notes fatigue.  6. Other depression with emotional eating Ricki describes herself as a "complusive eater". She lives with her mother and notes it is a challenge to eat healthy with her there. She notes emotional eating.  7. At risk for diabetes mellitus Nettie is at higher than average risk for developing diabetes due to her obesity.   Assessment/Plan:   1. Other fatigue Dynasia does feel that her weight is causing her energy to be lower than it should be. Fatigue may be related to obesity, depression or many other causes. Labs will be ordered, and in the meanwhile, Taejah will focus on self care including making healthy food choices, increasing physical activity and focusing on stress reduction.  - EKG 12-Lead - Comprehensive metabolic panel - CBC with Differential/Platelet - Hemoglobin A1c - Insulin, random - Lipid Panel With LDL/HDL Ratio - VITAMIN D 25 Hydroxy (  Vit-D Deficiency, Fractures) - Vitamin B12 - Folate - T3 - T4, free - TSH  2. Shortness of breath on exertion Emanuelle does feel that she gets out of breath more easily that she used to when she exercises. Aleysha's shortness of breath appears to be obesity related and exercise induced. She has agreed to work  on weight loss and gradually increase exercise to treat her exercise induced shortness of breath. Will continue to monitor closely.  3. Other hyperlipidemia Cardiovascular risk and specific lipid/LDL goals reviewed. We discussed several lifestyle modifications today and Mikenna will continue to work on diet, exercise and weight loss efforts. We will check labs today. Orders and follow up as documented in patient record.   - Comprehensive metabolic panel - CBC with Differential/Platelet - Hemoglobin A1c - Insulin, random - Lipid Panel With LDL/HDL Ratio - T3 - T4, free - TSH  4. Other neutropenia ([place]) We will check labs today, and will follow up.  - Comprehensive metabolic panel - CBC with Differential/Platelet - Hemoglobin A1c - Insulin, random - Lipid Panel With LDL/HDL Ratio - Vitamin B12 - Folate  5. Vitamin D deficiency Low Vitamin D level contributes to fatigue and are associated with obesity, breast, and colon cancer. We will check labs today. Quanta will follow-up for routine testing of Vitamin D, at least 2-3 times per year to avoid over-replacement.  - VITAMIN D 25 Hydroxy (Vit-D Deficiency, Fractures)  6. Other depression with emotional eating Behavior modification techniques were discussed today to help Eryka deal with her emotional/non-hunger eating behaviors.  Orders and follow up as documented in patient record.   7. At risk for diabetes mellitus Aaliyha was given approximately 30 minutes of diabetes education and counseling today. We discussed intensive lifestyle modifications today with an emphasis on weight loss as well as increasing exercise and decreasing simple carbohydrates in her diet. We also reviewed medication options with an emphasis on risk versus benefit of those discussed.   Repetitive spaced learning was employed today to elicit superior memory formation and behavioral change.  8. Class 2 severe obesity with serious comorbidity and body mass  index (BMI) of 35.0 to 35.9 in adult, unspecified obesity type (HCC) Enolia is currently in the action stage of change and her goal is to continue with weight loss efforts. I recommend Lizzete begin the structured treatment plan as follows:  She has agreed to the [employer] with chicken substitution as needed.  Exercise goals: As is.   Behavioral modification strategies: increasing lean protein intake, meal planning and cooking strategies and planning for success.  She was informed of the importance of frequent follow-up visits to maximize her success with intensive lifestyle modifications for her multiple health conditions. She was informed we would discuss her lab results at her next visit unless there is a critical issue that needs to be addressed sooner. Evadne agreed to keep her next visit at the agreed upon time to discuss these results.  Objective:   Blood pressure 139/69, pulse 71, temperature 97.9 F (36.6 C), temperature source Oral, height 5\' 6"  (1.676 m), weight 216 lb (98 kg), last menstrual period 12/01/2011, SpO2 99 %. Body mass index is 34.86 kg/m.  EKG: Normal sinus rhythm, rate 73 BPM.  Indirect Calorimeter completed today shows a VO2 of 184 and a REE of 1281.  Her calculated basal metabolic rate is 1749 thus her basal metabolic rate is worse than expected.  General: Cooperative, alert, well developed, in no acute distress. HEENT: Conjunctivae and lids  unremarkable. Cardiovascular: Regular rhythm.  Lungs: Normal work of breathing. Neurologic: No focal deficits.   Lab Results  Component Value Date   CREATININE 0.68 08/12/2019   BUN 10 08/12/2019   NA 140 08/12/2019   K 4.1 08/12/2019   CL 102 08/12/2019   CO2 27 08/12/2019   Lab Results  Component Value Date   ALT 12 08/12/2019   AST 21 08/12/2019   ALKPHOS 57 08/12/2019   BILITOT 0.4 08/12/2019   Lab Results  Component Value Date   HGBA1C 5.4 08/12/2019   HGBA1C 5.3 12/19/2017   Lab Results    Component Value Date   INSULIN 4.2 08/12/2019   Lab Results  Component Value Date   TSH 0.958 08/12/2019   Lab Results  Component Value Date   CHOL 238 (H) 08/12/2019   HDL 96 08/12/2019   LDLCALC 133 (H) 08/12/2019   TRIG 54 08/12/2019   CHOLHDL 2.8 06/23/2019   Lab Results  Component Value Date   WBC 3.2 (L) 08/12/2019   HGB 12.5 08/12/2019   HCT 38.0 08/12/2019   MCV 89 08/12/2019   PLT 245 08/12/2019   No results found for: IRON, TIBC, FERRITIN  Attestation Statements:   Reviewed by clinician on day of visit: allergies, medications, problem list, medical history, surgical history, family history, social history, and previous encounter notes.   I, Trixie Dredge, am acting as transcriptionist for Dennard Nip, MD.  I have reviewed the above documentation for accuracy and completeness, and I agree with the above. - Dennard Nip, MD

## 2019-08-19 ENCOUNTER — Telehealth (INDEPENDENT_AMBULATORY_CARE_PROVIDER_SITE_OTHER): Payer: BC Managed Care – PPO | Admitting: Psychology

## 2019-08-19 ENCOUNTER — Other Ambulatory Visit: Payer: Self-pay

## 2019-08-19 DIAGNOSIS — F5089 Other specified eating disorder: Secondary | ICD-10-CM | POA: Diagnosis not present

## 2019-08-21 ENCOUNTER — Other Ambulatory Visit: Payer: Self-pay | Admitting: Nurse Practitioner

## 2019-08-24 NOTE — Progress Notes (Signed)
  Office: [phone]  /  Fax: [phone]    Date: September 07, 2019   Appointment Start Time: 1:57am Duration: 24 minutes Provider: Glennie Isle, Psy.D. Type of Session: Individual Therapy  Location of Patient: Work Biomedical scientist of Provider: [facility] Type of Contact: Telepsychological Visit via MyChart Video Visit  Session Content: Brenda Gonzales is a 54 y.o. female presenting via [place] Visit for a follow-up appointment to address the previously established treatment goal of increasing coping skills. Today's appointment was a telepsychological visit due to COVID-19. Shahla provided verbal consent for today's telepsychological appointment and she is aware she is responsible for securing confidentiality on her end of the session. Prior to proceeding with today's appointment, [name]'s physical location at the time of this appointment was obtained as well a phone number she could be reached at in the event of technical difficulties. [name] and this provider participated in today's telepsychological service. Of note, today's appointment was switched to a regular telephone call with [name]'s verbal consent at 2:04pm due to an unstable connection.   This provider conducted a brief check-in. 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 She acknowledged deviations from her meal plan on July 4th as she was away from home; however, she discussed making better choices. Emotional and physical hunger were reviewed. Psychoeducation regarding triggers for emotional eating was provided. [name] was provided a handout, and encouraged to utilize the handout between now and the next appointment to increase awareness of triggers and frequency. [name] agreed. This provider also discussed behavioral strategies for specific triggers, such as placing the utensil down when conversing to avoid mindless eating. [name] provided verbal consent during today's appointment for this  provider to send a handout about triggers via e-mail. [name] was receptive to today's appointment as evidenced by openness to sharing, responsiveness to feedback, and willingness to explore triggers for emotional eating.  Mental Status Examination:  Appearance: well groomed and appropriate hygiene  Behavior: appropriate to circumstances Mood: euthymic Affect: mood congruent Speech: normal in rate, volume, and tone Eye Contact: appropriate Psychomotor Activity: appropriate Gait: unable to assess Thought Process: linear, logical, and goal directed  Thought Content/Perception: no hallucinations, delusions, bizarre thinking or behavior reported or observed and no evidence of suicidal and homicidal ideation, plan, and intent Orientation: time, person, place, and purpose of appointment Memory/Concentration: memory, attention, language, and fund of knowledge intact  Insight/Judgment: good  Interventions:  Conducted a brief chart review Provided empathic reflections and validation Reviewed content from the previous session Employed supportive psychotherapy interventions to facilitate reduced distress and to improve coping skills with identified stressors Psychoeducation provided regarding triggers for emotional eating  DSM-5 Diagnosis(es): 307.59 (F50.8) Other Specified Feeding or Eating Disorder, Emotional Eating and Binge Eating Behaviors  Treatment Goal & Progress: During the initial appointment with this provider, the following treatment goal was established: increase coping skills. [name] has demonstrated progress in her goal as evidenced by increased awareness of hunger patterns.   Plan: Based on recent progress, frequency of appointments will be reduced. As such, the next appointment will be scheduled in three weeks, which will be via MyChart Video Visit. The next session will focus on working towards the established treatment goal.

## 2019-08-26 ENCOUNTER — Other Ambulatory Visit: Payer: Self-pay

## 2019-08-26 ENCOUNTER — Ambulatory Visit (INDEPENDENT_AMBULATORY_CARE_PROVIDER_SITE_OTHER): Payer: BC Managed Care – PPO | Admitting: Family Medicine

## 2019-08-26 VITALS — BP 130/81 | HR 83 | Temp 97.3°F | Ht 66.0 in | Wt 208.0 lb

## 2019-08-26 DIAGNOSIS — E669 Obesity, unspecified: Secondary | ICD-10-CM | POA: Diagnosis not present

## 2019-08-26 DIAGNOSIS — E7849 Other hyperlipidemia: Secondary | ICD-10-CM

## 2019-08-26 DIAGNOSIS — Z9189 Other specified personal risk factors, not elsewhere classified: Secondary | ICD-10-CM | POA: Diagnosis not present

## 2019-08-26 DIAGNOSIS — E559 Vitamin D deficiency, unspecified: Secondary | ICD-10-CM | POA: Diagnosis not present

## 2019-08-26 DIAGNOSIS — Z6833 Body mass index (BMI) 33.0-33.9, adult: Secondary | ICD-10-CM

## 2019-08-30 NOTE — Progress Notes (Signed)
Chief Complaint:   OBESITY Brenda Gonzales is here to discuss her progress with her obesity treatment plan along with follow-up of her obesity related diagnoses. Brenda Gonzales is on the [employer] with chicken substitution and states she is following her eating plan approximately 99% of the time. Brenda Gonzales states she is doing 0 minutes 0 times per week.  Today's visit was #: 2 Starting weight: 216 lbs Starting date: 08/12/2019 Today's weight: 208 lbs Today's date: 08/26/2019 Total lbs lost to date: 8 Total lbs lost since last in-office visit: 8  Interim History: Brenda Gonzales has done well with weight loss on her Pescatarian plan. She felt she was in sugar withdrawal for the first 3-4 days, and then this improved.  Subjective:   1. Vitamin D deficiency Brenda Gonzales has a new diagnosis of Vit D deficiency. Her Vit D level is low and she is not on Vit D supplementation. I discussed labs with the patient today.  2. Other hyperlipidemia Brenda Gonzales is stable on Pravachol, and her HDL and triglycerides are very well controlled. Her LDL is still above goal. I discussed labs with the patient today.  Assessment/Plan:   1. Vitamin D deficiency Low Vitamin D level contributes to fatigue and are associated with obesity, breast, and colon cancer. Brenda Gonzales agreed to start prescription Vitamin D 50,000 IU every week with no refills. She will follow-up for routine testing of Vitamin D, at least 2-3 times per year to avoid over-replacement.  - Vitamin D, Ergocalciferol, (DRISDOL) 1.25 MG (50000 UNIT) CAPS capsule; Take 1 capsule (50,000 Units total) by mouth every 7 (seven) days.  Dispense: 4 capsule; Refill: 0  2. Other hyperlipidemia Cardiovascular risk and specific lipid/LDL goals reviewed. We discussed several lifestyle modifications today. Brenda Gonzales will continue to work on diet, exercise and weight loss efforts. She will continue her medications and we will recheck labs in 3 months. Orders and follow up as documented  in patient record.   3. Class 1 obesity with serious comorbidity and body mass index (BMI) of 33.0 to 33.9 in adult, unspecified obesity type Brenda Gonzales is currently in the action stage of change. As such, her goal is to continue with weight loss efforts. She has agreed to the [employer] + 150 calories.   Behavioral modification strategies: increasing lean protein intake.  Brenda Gonzales has agreed to follow-up with our clinic in 2 weeks. She was informed of the importance of frequent follow-up visits to maximize her success with intensive lifestyle modifications for her multiple health conditions.   Objective:   Blood pressure 130/81, pulse 83, temperature (!) 97.3 F (36.3 C), temperature source Oral, height 5\' 6"  (1.676 m), weight 208 lb (94.3 kg), last menstrual period 12/01/2011, SpO2 96 %. Body mass index is 33.57 kg/m.  General: Cooperative, alert, well developed, in no acute distress. HEENT: Conjunctivae and lids unremarkable. Cardiovascular: Regular rhythm.  Lungs: Normal work of breathing. Neurologic: No focal deficits.   Lab Results  Component Value Date   CREATININE 0.68 08/12/2019   BUN 10 08/12/2019   NA 140 08/12/2019   K 4.1 08/12/2019   CL 102 08/12/2019   CO2 27 08/12/2019   Lab Results  Component Value Date   ALT 12 08/12/2019   AST 21 08/12/2019   ALKPHOS 57 08/12/2019   BILITOT 0.4 08/12/2019   Lab Results  Component Value Date   HGBA1C 5.4 08/12/2019   HGBA1C 5.3 12/19/2017   Lab Results  Component Value Date   INSULIN 4.2 08/12/2019   Lab Results  Component Value Date   TSH 0.958 08/12/2019   Lab Results  Component Value Date   CHOL 238 (H) 08/12/2019   HDL 96 08/12/2019   LDLCALC 133 (H) 08/12/2019   TRIG 54 08/12/2019   CHOLHDL 2.8 06/23/2019   Lab Results  Component Value Date   WBC 3.2 (L) 08/12/2019   HGB 12.5 08/12/2019   HCT 38.0 08/12/2019   MCV 89 08/12/2019   PLT 245 08/12/2019   No results found for: IRON, TIBC,  FERRITIN  Attestation Statements:   Reviewed by clinician on day of visit: allergies, medications, problem list, medical history, surgical history, family history, social history, and previous encounter notes.   I, Trixie Dredge, am acting as transcriptionist for Dennard Nip, MD.  I have reviewed the above documentation for accuracy and completeness, and I agree with the above. -  Dennard Nip, MD

## 2019-09-01 ENCOUNTER — Encounter (INDEPENDENT_AMBULATORY_CARE_PROVIDER_SITE_OTHER): Payer: Self-pay | Admitting: Family Medicine

## 2019-09-01 ENCOUNTER — Other Ambulatory Visit (INDEPENDENT_AMBULATORY_CARE_PROVIDER_SITE_OTHER): Payer: Self-pay

## 2019-09-01 DIAGNOSIS — E559 Vitamin D deficiency, unspecified: Secondary | ICD-10-CM

## 2019-09-01 MED ORDER — VITAMIN D (ERGOCALCIFEROL) 1.25 MG (50000 UNIT) PO CAPS: 50000.0000 [IU] | ORAL_CAPSULE | ORAL | 0 refills | Status: DC

## 2019-09-02 ENCOUNTER — Encounter (INDEPENDENT_AMBULATORY_CARE_PROVIDER_SITE_OTHER): Payer: Self-pay

## 2019-09-07 ENCOUNTER — Telehealth (INDEPENDENT_AMBULATORY_CARE_PROVIDER_SITE_OTHER): Payer: BC Managed Care – PPO | Admitting: Psychology

## 2019-09-07 ENCOUNTER — Other Ambulatory Visit: Payer: Self-pay

## 2019-09-07 DIAGNOSIS — F5089 Other specified eating disorder: Secondary | ICD-10-CM

## 2019-09-09 ENCOUNTER — Encounter (INDEPENDENT_AMBULATORY_CARE_PROVIDER_SITE_OTHER): Payer: Self-pay | Admitting: Adult Health

## 2019-09-09 ENCOUNTER — Ambulatory Visit (INDEPENDENT_AMBULATORY_CARE_PROVIDER_SITE_OTHER): Payer: BC Managed Care – PPO | Admitting: Adult Health

## 2019-09-09 ENCOUNTER — Other Ambulatory Visit: Payer: Self-pay

## 2019-09-09 VITALS — BP 134/80 | HR 68 | Temp 97.5°F | Ht 66.0 in | Wt 207.0 lb

## 2019-09-09 DIAGNOSIS — E559 Vitamin D deficiency, unspecified: Secondary | ICD-10-CM | POA: Diagnosis not present

## 2019-09-09 DIAGNOSIS — E782 Mixed hyperlipidemia: Secondary | ICD-10-CM

## 2019-09-09 DIAGNOSIS — E669 Obesity, unspecified: Secondary | ICD-10-CM | POA: Diagnosis not present

## 2019-09-09 DIAGNOSIS — Z6833 Body mass index (BMI) 33.0-33.9, adult: Secondary | ICD-10-CM | POA: Diagnosis not present

## 2019-09-09 NOTE — Progress Notes (Signed)
Chief Complaint:   OBESITY Brenda Gonzales is here to discuss her progress with her obesity treatment plan along with follow-up of her obesity related diagnoses. Brenda Gonzales is on the [employer] + 150 calories and states she is following her eating plan approximately 70% of the time. Brenda Gonzales states she is exercising 0 minutes 0 times per week.  Today's visit was #: 3 Starting weight: 216 lbs Starting date: 08/12/2019 Today's weight: 207 lbs Today's date: 09/09/2019 Total lbs lost to date: 9 Total lbs lost since last in-office visit: 1  Interim History: Brenda Gonzales has enjoyed the [employer] plan with a few minor challenges with breakfast. She prefers hot foods for her first meal. She is getting a little tired of eggs and would like additional breakfast options. She denies excessive cravings or polyphagia.  Subjective:   Vitamin D deficiency. Brenda Gonzales is on prescription strength Vitamin D supplementation and is tolerating it well. No nausea, vomiting, or muscle weakness.    Ref. Range 08/12/2019 16:36  Vitamin D, 25-Hydroxy Latest Ref Range: 30.0 - 100.0 ng/mL 29.2 (L)   Hyperlipidemia, mixed. Brenda Gonzales is on pravastatin 20 mg daily and denies myalgias.   Lab Results  Component Value Date   CHOL 238 (H) 08/12/2019   HDL 96 08/12/2019   LDLCALC 133 (H) 08/12/2019   TRIG 54 08/12/2019   CHOLHDL 2.8 06/23/2019   Lab Results  Component Value Date   ALT 12 08/12/2019   AST 21 08/12/2019   ALKPHOS 57 08/12/2019   BILITOT 0.4 08/12/2019   The 10-year ASCVD risk score Brenda Bussing DC Jr., et al., 2013) is: 2.2%   Values used to calculate the score:     Age: 54 years     Sex: Female     Is Non-Hispanic African American: Yes     Diabetic: No     Tobacco smoker: No     Systolic Blood Pressure: 209 mmHg     Is BP treated: No     HDL Cholesterol: 96 mg/dL     Total Cholesterol: 238 mg/dL  Assessment/Plan:   Vitamin D deficiency. Low Vitamin D level contributes to fatigue and are  associated with obesity, breast, and colon cancer. She agrees to continue to take prescription Vitamin D as directed (no medication refill today) and will follow-up for routine testing of Vitamin D every 3 months.  Hyperlipidemia, mixed. Cardiovascular risk and specific lipid/LDL goals reviewed.  We discussed several lifestyle modifications today and Brenda Gonzales will continue to work on diet, exercise and weight loss efforts. Orders and follow up as documented in patient record. She will continue her current statin therapy and continue the Pescatarian meal plan.  Counseling Intensive lifestyle modifications are the first line treatment for this issue. . Dietary changes: Increase soluble fiber. Decrease simple carbohydrates. . Exercise changes: Moderate to vigorous-intensity aerobic activity 150 minutes per week if tolerated. . Lipid-lowering medications: see documented in medical record.  Class 1 obesity with serious comorbidity and body mass index (BMI) of 33.0 to 33.9 in adult, unspecified obesity type.  Brenda Gonzales is currently in the action stage of change. As such, her goal is to continue with weight loss efforts. She has agreed to the [employer] + 150 calories.   Handouts were provided on Additional Breakfast Options, 100 Calorie Snacks, and High Protein/Low Calorie Foods.  Exercise goals: No exercise has been prescribed at this time.  Behavioral modification strategies: increasing lean protein intake, increasing water intake, meal planning and cooking strategies and  planning for success.  Brenda Gonzales has agreed to follow-up with our clinic in 2 weeks. She was informed of the importance of frequent follow-up visits to maximize her success with intensive lifestyle modifications for her multiple health conditions.   Objective:   Blood pressure 134/80, pulse 68, temperature (!) 97.5 F (36.4 C), temperature source Oral, height 5\' 6"  (1.676 m), weight 207 lb (93.9 kg), last menstrual period  12/01/2011, SpO2 100 %. Body mass index is 33.41 kg/m.  General: Cooperative, alert, well developed, in no acute distress. HEENT: Conjunctivae and lids unremarkable. Cardiovascular: Regular rhythm.  Lungs: Normal work of breathing. Neurologic: No focal deficits.   Lab Results  Component Value Date   CREATININE 0.68 08/12/2019   BUN 10 08/12/2019   NA 140 08/12/2019   K 4.1 08/12/2019   CL 102 08/12/2019   CO2 27 08/12/2019   Lab Results  Component Value Date   ALT 12 08/12/2019   AST 21 08/12/2019   ALKPHOS 57 08/12/2019   BILITOT 0.4 08/12/2019   Lab Results  Component Value Date   HGBA1C 5.4 08/12/2019   HGBA1C 5.3 12/19/2017   Lab Results  Component Value Date   INSULIN 4.2 08/12/2019   Lab Results  Component Value Date   TSH 0.958 08/12/2019   Lab Results  Component Value Date   CHOL 238 (H) 08/12/2019   HDL 96 08/12/2019   LDLCALC 133 (H) 08/12/2019   TRIG 54 08/12/2019   CHOLHDL 2.8 06/23/2019   Lab Results  Component Value Date   WBC 3.2 (L) 08/12/2019   HGB 12.5 08/12/2019   HCT 38.0 08/12/2019   MCV 89 08/12/2019   PLT 245 08/12/2019   No results found for: IRON, TIBC, FERRITIN  Attestation Statements:   Reviewed by clinician on day of visit: allergies, medications, problem list, medical history, surgical history, family history, social history, and previous encounter notes.  Time spent on visit including pre-visit chart review and post-visit charting and care was 27 minutes.   I, Michaelene Song, am acting as Location manager for [employer], NP-C   I have reviewed the above documentation for accuracy and completeness, and I agree with the above. -  Esaw Grandchild, NP

## 2019-09-14 NOTE — Progress Notes (Signed)
  Office: [phone]  /  Fax: [phone]    Date: September 28, 2019   Appointment Start Time: 2:02pm Duration: 25 minutes Provider: Glennie Isle, Psy.D. Type of Session: Individual Therapy  Location of Patient: Work Biomedical scientist of Provider: [facility] Type of Contact: Telepsychological Visit via MyChart Video Visit  Session Content: Brenda Gonzales is a 54 y.o. female presenting via [place] Visit for a follow-up appointment to address the previously established treatment goal of increasing coping skills. Today's appointment was a telepsychological visit due to COVID-19. Betsy provided verbal consent for today's telepsychological appointment and she is aware she is responsible for securing confidentiality on her end of the session. Prior to proceeding with today's appointment, Audryanna's physical location at the time of this appointment was obtained as well a phone number she could be reached at in the event of technical difficulties. Alixandrea and this provider participated in today's telepsychological service. Of note, today's appointment was switched to a regular telephone call with Tyesha's verbal consent at 2:16pm due to an unstable connection.   This provider conducted a brief check-in. [name] stated she is "very busy" at work. Regarding eating, [name] stated it is going "pretty good." She continues to note a reduction in emotional/binge eating behaviors. Psychoeducation regarding mindfulness was provided. A handout was provided to [facility] with further information regarding mindfulness, including exercises. This provider also explained the benefit of mindfulness as it relates to emotional eating. Deborha was encouraged to engage in the provided exercises between now and the next appointment with this provider. Azzie agreed. During today's appointment, [name] was led through a mindfulness exercise involving her senses. Graclynn provided verbal consent during today's  appointment for this provider to send a handout about mindfulness via e-mail. [name] was receptive to today's appointment as evidenced by openness to sharing, responsiveness to feedback, and willingness to engage in mindfulness exercises to assist with coping.  Mental Status Examination:  Appearance: well groomed and appropriate hygiene  Behavior: appropriate to circumstances Mood: euthymic Affect: mood congruent Speech: normal in rate, volume, and tone Eye Contact: appropriate Psychomotor Activity: appropriate Gait: unable to assess Thought Process: linear, logical, and goal directed  Thought Content/Perception: no hallucinations, delusions, bizarre thinking or behavior reported or observed and no evidence of suicidal and homicidal ideation, plan, and intent Orientation: time, person, place, and purpose of appointment Memory/Concentration: memory, attention, language, and fund of knowledge intact  Insight/Judgment: good  Interventions:  Conducted a brief chart review Provided empathic reflections and validation Employed supportive psychotherapy interventions to facilitate reduced distress and to improve coping skills with identified stressors Psychoeducation provided regarding mindfulness Engaged patient in mindfulness exercise(s) Employed acceptance and commitment interventions to emphasize mindfulness and acceptance without struggle   DSM-5 Diagnosis(es): 307.59 (F50.8) Other Specified Feeding or Eating Disorder, Emotional Eating and Binge Eating Behaviors  Treatment Goal & Progress: During the initial appointment with this provider, the following treatment goal was established: increase coping skills. [name] has demonstrated progress in her goal as evidenced by increased awareness of hunger patterns and increased awareness of triggers for emotional eating. [name] also demonstrates willingness to engage in mindfulness exercises.  Plan: The next appointment will be scheduled in  three weeks, which will be via MyChart Video Visit. The next session will focus on working towards the established treatment goal.

## 2019-09-21 ENCOUNTER — Other Ambulatory Visit (INDEPENDENT_AMBULATORY_CARE_PROVIDER_SITE_OTHER): Payer: Self-pay | Admitting: Family Medicine

## 2019-09-21 DIAGNOSIS — E559 Vitamin D deficiency, unspecified: Secondary | ICD-10-CM

## 2019-09-27 ENCOUNTER — Other Ambulatory Visit: Payer: Self-pay

## 2019-09-27 ENCOUNTER — Encounter (INDEPENDENT_AMBULATORY_CARE_PROVIDER_SITE_OTHER): Payer: Self-pay | Admitting: Family Medicine

## 2019-09-27 ENCOUNTER — Ambulatory Visit (INDEPENDENT_AMBULATORY_CARE_PROVIDER_SITE_OTHER): Payer: BC Managed Care – PPO | Admitting: Family Medicine

## 2019-09-27 VITALS — BP 130/80 | HR 67 | Temp 98.2°F | Ht 66.0 in | Wt 205.0 lb

## 2019-09-27 DIAGNOSIS — E559 Vitamin D deficiency, unspecified: Secondary | ICD-10-CM | POA: Diagnosis not present

## 2019-09-27 DIAGNOSIS — E669 Obesity, unspecified: Secondary | ICD-10-CM

## 2019-09-27 DIAGNOSIS — E7849 Other hyperlipidemia: Secondary | ICD-10-CM

## 2019-09-27 DIAGNOSIS — Z9189 Other specified personal risk factors, not elsewhere classified: Secondary | ICD-10-CM

## 2019-09-27 DIAGNOSIS — Z6833 Body mass index (BMI) 33.0-33.9, adult: Secondary | ICD-10-CM

## 2019-09-27 MED ORDER — VITAMIN D (ERGOCALCIFEROL) 1.25 MG (50000 UNIT) PO CAPS: 50000.0000 [IU] | ORAL_CAPSULE | ORAL | 0 refills | Status: DC

## 2019-09-28 ENCOUNTER — Telehealth (INDEPENDENT_AMBULATORY_CARE_PROVIDER_SITE_OTHER): Payer: BC Managed Care – PPO | Admitting: Psychology

## 2019-09-28 DIAGNOSIS — F5089 Other specified eating disorder: Secondary | ICD-10-CM | POA: Diagnosis not present

## 2019-09-28 NOTE — Progress Notes (Signed)
Chief Complaint:   OBESITY Brenda Gonzales is here to discuss her progress with her obesity treatment plan along with follow-up of her obesity related diagnoses. Brenda Gonzales is on the [employer] +150 calories and states she is following her eating plan approximately 95% of the time. Brenda Gonzales states she is exercising for 0 minutes 0 times per week.  Today's visit was #: 4 Starting weight: 216 lbs Starting date: 08/12/2019 Today's weight: 205 lbs Today's date: 09/27/2019 Total lbs lost to date: 11 lbs Total lbs lost since last in-office visit: 2 lbs  Interim History: Brenda Gonzales says, "I did ok.  I lack variety for dinner, getting burnt out for dinner."  Overall, she likes the plan.  Her hunger is well-controlled when on the plan.  Cravings are controlled.  Denies concerns.  Subjective:   1. Other hyperlipidemia Brenda Gonzales has hyperlipidemia and has been trying to improve her cholesterol levels with intensive lifestyle modification including a low saturated fat diet, exercise and weight loss. She denies any chest pain, claudication or myalgias.  LDL is not at goal.  She had side effects of higher doses of statins.  She had muscle lock-up on Lipitor.  Lab Results  Component Value Date   ALT 12 08/12/2019   AST 21 08/12/2019   ALKPHOS 57 08/12/2019   BILITOT 0.4 08/12/2019   Lab Results  Component Value Date   CHOL 238 (H) 08/12/2019   HDL 96 08/12/2019   LDLCALC 133 (H) 08/12/2019   TRIG 54 08/12/2019   CHOLHDL 2.8 06/23/2019   2. Vitamin D deficiency Brenda Gonzales's Vitamin D level was 29.2 on 08/12/2019. She is currently taking prescription vitamin D 50,000 IU each week. She denies nausea, vomiting or muscle weakness.    3. At risk for heart disease Brenda Gonzales is at a higher than average risk for cardiovascular disease due to obesity.   Assessment/Plan:   1. Other hyperlipidemia Cardiovascular risk and specific lipid/LDL goals reviewed.  We discussed several lifestyle modifications today and  Brenda Gonzales will continue to work on diet, exercise and weight loss efforts. Orders and follow up as documented in patient record.  Continue medications per PCP for now.  Work on prudent nutritional plan, weight loss.  If not at goal in the fall, may need medication adjustment.  Counseling Intensive lifestyle modifications are the first line treatment for this issue. . Dietary changes: Increase soluble fiber. Decrease simple carbohydrates. . Exercise changes: Moderate to vigorous-intensity aerobic activity 150 minutes per week if tolerated. . Lipid-lowering medications: see documented in medical record.  2. Vitamin D deficiency Low Vitamin D level contributes to fatigue and are associated with obesity, breast, and colon cancer. She agrees to continue to take prescription Vitamin D @50 ,000 IU every week and will follow-up for routine testing of Vitamin D, at least 2-3 times per year to avoid over-replacement. - Vitamin D, Ergocalciferol, (DRISDOL) 1.25 MG (50000 UNIT) CAPS capsule; Take 1 capsule (50,000 Units total) by mouth every 7 (seven) days.  Dispense: 4 capsule; Refill: 0  3. At risk for heart disease Brenda Gonzales was given approximately 15 minutes of coronary artery disease prevention counseling today. She is 54 y.o. female and has risk factors for heart disease including obesity. We discussed intensive lifestyle modifications today with an emphasis on specific weight loss instructions and strategies.   Repetitive spaced learning was employed today to elicit superior memory formation and behavioral change.  4. Class 1 obesity with serious comorbidity and body mass index (BMI) of 33.0 to 33.9 in  adult, unspecified obesity type Brenda Gonzales is currently in the action stage of change. As such, her goal is to continue with weight loss efforts. She has agreed to the [employer] +150 calories.   Exercise goals: As is.  Behavioral modification strategies: increasing lean protein intake, increasing water  intake, keeping healthy foods in the home, better snacking choices and planning for success.  We discussed several recipes and ideas for dinner.  Handouts given.  Brenda Gonzales has agreed to follow-up with our clinic in 2 weeks. She was informed of the importance of frequent follow-up visits to maximize her success with intensive lifestyle modifications for her multiple health conditions.   Objective:   Blood pressure (!) 130/80, pulse 67, temperature 98.2 F (36.8 C), height 5\' 6"  (1.676 m), weight (!) 205 lb (93 kg), last menstrual period 12/01/2011, SpO2 99 %. Body mass index is 33.09 kg/m.  General: Cooperative, alert, well developed, in no acute distress. HEENT: Conjunctivae and lids unremarkable. Cardiovascular: Regular rhythm.  Lungs: Normal work of breathing. Neurologic: No focal deficits.   Lab Results  Component Value Date   CREATININE 0.68 08/12/2019   BUN 10 08/12/2019   NA 140 08/12/2019   K 4.1 08/12/2019   CL 102 08/12/2019   CO2 27 08/12/2019   Lab Results  Component Value Date   ALT 12 08/12/2019   AST 21 08/12/2019   ALKPHOS 57 08/12/2019   BILITOT 0.4 08/12/2019   Lab Results  Component Value Date   HGBA1C 5.4 08/12/2019   HGBA1C 5.3 12/19/2017   Lab Results  Component Value Date   INSULIN 4.2 08/12/2019   Lab Results  Component Value Date   TSH 0.958 08/12/2019   Lab Results  Component Value Date   CHOL 238 (H) 08/12/2019   HDL 96 08/12/2019   LDLCALC 133 (H) 08/12/2019   TRIG 54 08/12/2019   CHOLHDL 2.8 06/23/2019   Lab Results  Component Value Date   WBC 3.2 (L) 08/12/2019   HGB 12.5 08/12/2019   HCT 38.0 08/12/2019   MCV 89 08/12/2019   PLT 245 08/12/2019   Attestation Statements:   Reviewed by clinician on day of visit: allergies, medications, problem list, medical history, surgical history, family history, social history, and previous encounter notes.  I, Water quality scientist, CMA, am acting as Location manager for [employer],  DO.  I have reviewed the above documentation for accuracy and completeness, and I agree with the above. Mellody Dance, DO

## 2019-10-05 NOTE — Progress Notes (Signed)
  Office: [phone]  /  Fax: [phone]    Date: October 19, 2019   Appointment Start Time: 2:34pm Duration: 26 minutes Provider: Glennie Isle, Psy.D. Type of Session: Individual Therapy  Location of Patient: Work Biomedical scientist of Provider: [facility] Type of Contact: Telepsychological Visit via MyChart Video Visit  Session Content: Brenda Gonzales is a 54 y.o. female presenting via [place] Visit for a follow-up appointment to address the previously established treatment goal of increasing coping skills. Today's appointment was a telepsychological visit due to COVID-19. Brenda Gonzales provided verbal consent for today's telepsychological appointment and she is aware she is responsible for securing confidentiality on her end of the session. Prior to proceeding with today's appointment, Brenda Gonzales's physical location at the time of this appointment was obtained as well a phone number she could be reached at in the event of technical difficulties. Brenda Gonzales and this provider participated in today's telepsychological service. Of note, today's appointment was switched to a regular telephone call at 2:36pm with Brenda Gonzales's verbal consent due to technical issues.   This provider conducted a brief check-in. Brenda Gonzales discussed experiencing "madness" on her end due to work. She stated she continues to follow her meal plan, but explained she had to schedule her next appointment with Dr. Raliegh Scarlet in a month due to her current schedule. Session focused further on mindfulness to assist with coping. She acknowledged challenges engaging in mindfulness exercises regularly due to her current work schedule. Brenda Gonzales was led through a mindfulness exercise (A Taste of Mindfulness) and her experience was processed. Brenda Gonzales provided verbal consent during today's appointment for this provider to send the handout for today's exercise via e-mail. This provider also discussed the utilization of YouTube for mindfulness  exercises (e.g., exercises by Merri Ray). Furthermore, termination planning was discussed. Brenda Gonzales was receptive to a follow-up appointment in 3-4 weeks and an additional follow-up/termination appointment in 3-4 weeks after that. Brenda Gonzales was receptive to today's appointment as evidenced by openness to sharing, responsiveness to feedback, and willingness to continue engaging in mindfulness exercises.  Mental Status Examination:  Appearance: well groomed and appropriate hygiene  Behavior: appropriate to circumstances Mood: euthymic Affect: mood congruent Speech: normal in rate, volume, and tone Eye Contact: appropriate Psychomotor Activity: appropriate Gait: unable to assess Thought Process: linear, logical, and goal directed  Thought Content/Perception: no hallucinations, delusions, bizarre thinking or behavior reported or observed and no evidence of suicidal and homicidal ideation, plan, and intent Orientation: time, person, place, and purpose of appointment Memory/Concentration: memory, attention, language, and fund of knowledge intact  Insight/Judgment: good  Interventions:  Conducted a brief chart review Provided empathic reflections and validation Employed supportive psychotherapy interventions to facilitate reduced distress and to improve coping skills with identified stressors Engaged patient in mindfulness exercise(s) Employed acceptance and commitment interventions to emphasize mindfulness and acceptance without struggle Discussed termination planning  DSM-5 Diagnosis(es): 307.59 (F50.8) Other Specified Feeding or Eating Disorder, Emotional Eating and Binge Eating Behaviors  Treatment Goal & Progress: During the initial appointment with this provider, the following treatment goal was established: increase coping skills. Brenda Gonzales has demonstrated progress in her goal as evidenced by increased awareness of hunger patterns and increased awareness of triggers for emotional eating.  Brenda Gonzales also continues to demonstrate willingness to engage in learned skill(s).  Plan: The next appointment will be scheduled in 3-4 weeks, which will be via MyChart Video Visit. The next session will focus on working towards the established treatment goal.

## 2019-10-07 ENCOUNTER — Telehealth: Payer: Self-pay | Admitting: Nurse Practitioner

## 2019-10-11 ENCOUNTER — Ambulatory Visit (INDEPENDENT_AMBULATORY_CARE_PROVIDER_SITE_OTHER): Payer: BC Managed Care – PPO | Admitting: Physician Assistant

## 2019-10-11 ENCOUNTER — Other Ambulatory Visit: Payer: Self-pay

## 2019-10-11 ENCOUNTER — Ambulatory Visit (INDEPENDENT_AMBULATORY_CARE_PROVIDER_SITE_OTHER): Payer: BC Managed Care – PPO | Admitting: Family Medicine

## 2019-10-11 ENCOUNTER — Encounter (INDEPENDENT_AMBULATORY_CARE_PROVIDER_SITE_OTHER): Payer: Self-pay | Admitting: Family Medicine

## 2019-10-11 VITALS — BP 123/77 | HR 70 | Temp 97.9°F | Ht 66.0 in | Wt 202.0 lb

## 2019-10-11 DIAGNOSIS — E7849 Other hyperlipidemia: Secondary | ICD-10-CM | POA: Diagnosis not present

## 2019-10-11 DIAGNOSIS — E559 Vitamin D deficiency, unspecified: Secondary | ICD-10-CM

## 2019-10-11 DIAGNOSIS — Z9189 Other specified personal risk factors, not elsewhere classified: Secondary | ICD-10-CM | POA: Diagnosis not present

## 2019-10-11 DIAGNOSIS — Z6832 Body mass index (BMI) 32.0-32.9, adult: Secondary | ICD-10-CM

## 2019-10-11 DIAGNOSIS — E669 Obesity, unspecified: Secondary | ICD-10-CM | POA: Diagnosis not present

## 2019-10-11 MED ORDER — VITAMIN D (ERGOCALCIFEROL) 1.25 MG (50000 UNIT) PO CAPS: 50000.0000 [IU] | ORAL_CAPSULE | ORAL | 0 refills | Status: DC

## 2019-10-12 ENCOUNTER — Encounter (INDEPENDENT_AMBULATORY_CARE_PROVIDER_SITE_OTHER): Payer: Self-pay | Admitting: Family Medicine

## 2019-10-12 NOTE — Progress Notes (Signed)
Chief Complaint:   OBESITY Brenda Gonzales is here to discuss her progress with her obesity treatment plan along with follow-up of her obesity related diagnoses. Brenda Gonzales is on the [employer] + 150 calories and states she is following her eating plan approximately 70% of the time. [name] states she is doing 0 minutes 0 times per week.  Today's visit was #: 5 Starting weight: 216 lbs Starting date: 08/12/2019 Today's weight: 202 lbs Today's date: 10/11/2019 Total lbs lost to date: 14 Total lbs lost since last in-office visit: 3  Interim History: [name] notes being very busy at work and she has been unable to exercise. She has done very well adhering to the plan and she is down 14 lbs since August 12, 2019. Her highest weight was 312 lbs several years ago and she has been working on losing weight since then.  Subjective:   1. Vitamin D deficiency Brenda Gonzales's last Vit D level was low at 29.2. She is on prescription Vit D.  2. Other hyperlipidemia Brenda Gonzales has hyperlipidemia and has been trying to improve her cholesterol levels with intensive lifestyle modification including a low saturated fat diet, exercise and weight loss. She is on pravastatin. Last LDL was high at 133, and HDL and triglycerides were within normal limits.   Lab Results  Component Value Date   ALT 12 08/12/2019   AST 21 08/12/2019   ALKPHOS 57 08/12/2019   BILITOT 0.4 08/12/2019   Lab Results  Component Value Date   CHOL 238 (H) 08/12/2019   HDL 96 08/12/2019   LDLCALC 133 (H) 08/12/2019   TRIG 54 08/12/2019   CHOLHDL 2.8 06/23/2019   3. At risk for osteoporosis [name] is at higher risk of osteopenia and osteoporosis due to Vitamin D deficiency.   Assessment/Plan:   1. Vitamin D deficiency Low Vitamin D level contributes to fatigue and are associated with obesity, breast, and colon cancer. We will refill prescription Vitamin D for 1 month. Kyerra will follow-up for routine testing of Vitamin D, at least  2-3 times per year to avoid over-replacement.  - Vitamin D, Ergocalciferol, (DRISDOL) 1.25 MG (50000 UNIT) CAPS capsule; Take 1 capsule (50,000 Units total) by mouth every 7 (seven) days.  Dispense: 4 capsule; Refill: 0  2. Other hyperlipidemia Cardiovascular risk and specific lipid/LDL goals reviewed. We discussed several lifestyle modifications today. Brenda Gonzales will continue pravastatin, and will continue to work on diet, exercise and weight loss efforts.   3. At risk for osteoporosis Brenda Gonzales was given approximately 15 minutes of osteoporosis prevention counseling today. Brenda Gonzales is at risk for osteopenia and osteoporosis due to her Vitamin D deficiency. She was encouraged to take her Vitamin D and follow her higher calcium diet and increase strengthening exercise to help strengthen her bones and decrease her risk of osteopenia and osteoporosis.  Repetitive spaced learning was employed today to elicit superior memory formation and behavioral change.  4. Class 1 obesity with serious comorbidity and body mass index (BMI) of 32.0 to 32.9 in adult, unspecified obesity type [name] is currently in the action stage of change. As such, her goal is to continue with weight loss efforts. She has agreed to following the Pescatarian plan + 150 calories and keeping a food journal and adhering to recommended goals of 400-600 calories and 35+ grams of protein at supper daily.   Exercise goals: No exercise has been prescribed at this time.  Behavioral modification strategies: meal planning and cooking strategies and dealing with family or coworker  sabotage.  Brenda Gonzales requests follow-up with our clinic in 8 weeks at her. She was informed of the importance of frequent follow-up visits to maximize her success with intensive lifestyle modifications for her multiple health conditions.   Objective:   Blood pressure 123/77, pulse 70, temperature 97.9 F (36.6 C), height 5\' 6"  (1.676 m), weight 202 lb (91.6 kg), last  menstrual period 12/01/2011, SpO2 99 %. Body mass index is 32.6 kg/m.  General: Cooperative, alert, well developed, in no acute distress. HEENT: Conjunctivae and lids unremarkable. Cardiovascular: Regular rhythm.  Lungs: Normal work of breathing. Neurologic: No focal deficits.   Lab Results  Component Value Date   CREATININE 0.68 08/12/2019   BUN 10 08/12/2019   NA 140 08/12/2019   K 4.1 08/12/2019   CL 102 08/12/2019   CO2 27 08/12/2019   Lab Results  Component Value Date   ALT 12 08/12/2019   AST 21 08/12/2019   ALKPHOS 57 08/12/2019   BILITOT 0.4 08/12/2019   Lab Results  Component Value Date   HGBA1C 5.4 08/12/2019   HGBA1C 5.3 12/19/2017   Lab Results  Component Value Date   INSULIN 4.2 08/12/2019   Lab Results  Component Value Date   TSH 0.958 08/12/2019   Lab Results  Component Value Date   CHOL 238 (H) 08/12/2019   HDL 96 08/12/2019   LDLCALC 133 (H) 08/12/2019   TRIG 54 08/12/2019   CHOLHDL 2.8 06/23/2019   Lab Results  Component Value Date   WBC 3.2 (L) 08/12/2019   HGB 12.5 08/12/2019   HCT 38.0 08/12/2019   MCV 89 08/12/2019   PLT 245 08/12/2019   No results found for: IRON, TIBC, FERRITIN  Attestation Statements:   Reviewed by clinician on day of visit: allergies, medications, problem list, medical history, surgical history, family history, social history, and previous encounter notes.   Wilhemena Durie, am acting as Location manager for [employer], FNP-C.  I have reviewed the above documentation for accuracy and completeness, and I agree with the above. -  Georgianne Fick, FNP

## 2019-10-13 ENCOUNTER — Telehealth: Payer: Self-pay | Admitting: Nurse Practitioner

## 2019-10-19 ENCOUNTER — Telehealth (INDEPENDENT_AMBULATORY_CARE_PROVIDER_SITE_OTHER): Payer: BC Managed Care – PPO | Admitting: Psychology

## 2019-10-19 ENCOUNTER — Other Ambulatory Visit: Payer: Self-pay

## 2019-10-19 DIAGNOSIS — F5089 Other specified eating disorder: Secondary | ICD-10-CM | POA: Diagnosis not present

## 2019-10-23 ENCOUNTER — Other Ambulatory Visit (INDEPENDENT_AMBULATORY_CARE_PROVIDER_SITE_OTHER): Payer: Self-pay | Admitting: Family Medicine

## 2019-10-23 DIAGNOSIS — E559 Vitamin D deficiency, unspecified: Secondary | ICD-10-CM

## 2019-10-28 NOTE — Progress Notes (Unsigned)
Office: [phone]  /  Fax: [phone]    Date: November 11, 2019   Appointment Start Time: *** Duration: *** minutes Provider: Glennie Isle, Psy.D. Type of Session: Individual Therapy  Location of Patient: {gbptloc:23249} Location of Provider: Provider's Home Type of Contact: Telepsychological Visit via MyChart Video Visit  Session Content: Brenda Gonzales is a 54 y.o. female presenting for a follow-up appointment to address the previously established treatment goal of increasing coping skills. Today's appointment was a telepsychological visit due to COVID-19. Brenda Gonzales provided verbal consent for today's telepsychological appointment and she is aware she is responsible for securing confidentiality on her end of the session. Prior to proceeding with today's appointment, Brenda Gonzales's physical location at the time of this appointment was obtained as well a phone number she could be reached at in the event of technical difficulties. Brenda Gonzales and this provider participated in today's telepsychological service.   This provider conducted a brief check-in and verbally administered the PHQ-9 and GAD-7. *** Brenda Gonzales was receptive to today's appointment as evidenced by openness to sharing, responsiveness to feedback, and {gbreceptiveness:23401}.  Mental Status Examination:  Appearance: {Appearance:22431} Behavior: {Behavior:22445} Mood: {gbmood:21757} Affect: {Affect:22436} Speech: {Speech:22432} Eye Contact: {Eye Contact:22433} Psychomotor Activity: {Motor Activity:22434} Gait: {gbgait:23404} Thought Process: {thought process:22448}  Thought Content/Perception: {disturbances:22451} Orientation: {Orientation:22437} Memory/Concentration: {gbcognition:22449} Insight/Judgment: {Insight:22446}  Structured Assessments Results: The Patient Health Questionnaire-9 (PHQ-9) is a self-report measure that assesses symptoms and severity of depression over the course of the last two weeks. Brenda Gonzales obtained a score  of *** suggesting {GBPHQ9SEVERITY:21752}. Brenda Gonzales finds the endorsed symptoms to be {gbphq9difficulty:21754}. [0= Not at all; 1= Several days; 2= More than half the days; 3= Nearly every day] Little interest or pleasure in doing things ***  Feeling down, depressed, or hopeless ***  Trouble falling or staying asleep, or sleeping too much ***  Feeling tired or having little energy ***  Poor appetite or overeating ***  Feeling bad about yourself --- or that you are a failure or have let yourself or your family down ***  Trouble concentrating on things, such as reading the newspaper or watching television ***  Moving or speaking so slowly that other people could have noticed? Or the opposite --- being so fidgety or restless that you have been moving around a lot more than usual ***  Thoughts that you would be better off dead or hurting yourself in some way ***  PHQ-9 Score ***    The Generalized Anxiety Disorder-7 (GAD-7) is a brief self-report measure that assesses symptoms of anxiety over the course of the last two weeks. Brenda Gonzales obtained a score of *** suggesting {gbgad7severity:21753}. Brenda Gonzales finds the endorsed symptoms to be {gbphq9difficulty:21754}. [0= Not at all; 1= Several days; 2= Over half the days; 3= Nearly every day] Feeling nervous, anxious, on edge ***  Not being able to stop or control worrying ***  Worrying too much about different things ***  Trouble relaxing ***  Being so restless that it's hard to sit still ***  Becoming easily annoyed or irritable ***  Feeling afraid as if something awful might happen ***  GAD-7 Score ***   Interventions:  {Interventions for Progress Notes:23405}  DSM-5 Diagnosis(es): 307.59 (F50.8) Other Specified Feeding or Eating Disorder, Emotional Eating and Binge Eating Behaviors  Treatment Goal & Progress: During the initial appointment with this provider, the following treatment goal was established: increase coping skills. Brenda Gonzales has  demonstrated progress in her goal as evidenced by {gbtxprogress:22839}. Brenda Gonzales also {gbtxprogress2:22951}.  Plan: The next appointment will be scheduled  in {gbweeks:21758}, which will be {gbtxmodality:23402}. The next session will focus on {Plan for Next Appointment:23400}.

## 2019-11-05 ENCOUNTER — Encounter (INDEPENDENT_AMBULATORY_CARE_PROVIDER_SITE_OTHER): Payer: Self-pay | Admitting: Family Medicine

## 2019-11-10 ENCOUNTER — Ambulatory Visit (INDEPENDENT_AMBULATORY_CARE_PROVIDER_SITE_OTHER): Payer: BC Managed Care – PPO | Admitting: Family Medicine

## 2019-11-11 ENCOUNTER — Telehealth (INDEPENDENT_AMBULATORY_CARE_PROVIDER_SITE_OTHER): Payer: BC Managed Care – PPO | Admitting: Psychology

## 2019-11-17 ENCOUNTER — Telehealth (INDEPENDENT_AMBULATORY_CARE_PROVIDER_SITE_OTHER): Payer: Self-pay | Admitting: Psychology

## 2019-11-17 NOTE — Telephone Encounter (Signed)
°  Office: [phone]  /  Fax: [phone]  Date of Call: November 17, 2019  Time of Call: 2:44pm Provider: Glennie Isle, PsyD  CONTENT: This provider called [name] to check-in and schedule a follow-up appointment. A HIPAA compliant voicemail was left requesting a call back.   PLAN: This provider will wait for [name] to call back. No further follow-up planned by this provider.

## 2019-11-26 DIAGNOSIS — Z20828 Contact with and (suspected) exposure to other viral communicable diseases: Secondary | ICD-10-CM | POA: Diagnosis not present

## 2019-11-26 DIAGNOSIS — R05 Cough: Secondary | ICD-10-CM | POA: Diagnosis not present

## 2019-11-27 DIAGNOSIS — Z20828 Contact with and (suspected) exposure to other viral communicable diseases: Secondary | ICD-10-CM | POA: Diagnosis not present

## 2019-11-27 DIAGNOSIS — R05 Cough: Secondary | ICD-10-CM | POA: Diagnosis not present

## 2019-12-06 DIAGNOSIS — H401111 Primary open-angle glaucoma, right eye, mild stage: Secondary | ICD-10-CM | POA: Diagnosis not present

## 2019-12-13 ENCOUNTER — Other Ambulatory Visit: Payer: Self-pay

## 2019-12-13 ENCOUNTER — Encounter: Payer: Self-pay | Admitting: Nurse Practitioner

## 2019-12-13 ENCOUNTER — Ambulatory Visit (INDEPENDENT_AMBULATORY_CARE_PROVIDER_SITE_OTHER): Payer: BC Managed Care – PPO | Admitting: Nurse Practitioner

## 2019-12-13 VITALS — BP 126/70 | HR 73 | Temp 98.1°F | Resp 20 | Ht 66.5 in | Wt 199.8 lb

## 2019-12-13 DIAGNOSIS — E559 Vitamin D deficiency, unspecified: Secondary | ICD-10-CM | POA: Diagnosis not present

## 2019-12-13 DIAGNOSIS — Z23 Encounter for immunization: Secondary | ICD-10-CM

## 2019-12-13 DIAGNOSIS — D709 Neutropenia, unspecified: Secondary | ICD-10-CM | POA: Diagnosis not present

## 2019-12-13 DIAGNOSIS — E6609 Other obesity due to excess calories: Secondary | ICD-10-CM

## 2019-12-13 DIAGNOSIS — Z1159 Encounter for screening for other viral diseases: Secondary | ICD-10-CM

## 2019-12-13 DIAGNOSIS — Z0000 Encounter for general adult medical examination without abnormal findings: Secondary | ICD-10-CM

## 2019-12-13 DIAGNOSIS — E782 Mixed hyperlipidemia: Secondary | ICD-10-CM | POA: Diagnosis not present

## 2019-12-13 DIAGNOSIS — Z6831 Body mass index (BMI) 31.0-31.9, adult: Secondary | ICD-10-CM

## 2019-12-13 MED ORDER — VITAMIN D (ERGOCALCIFEROL) 1.25 MG (50000 UNIT) PO CAPS: 50000.0000 [IU] | ORAL_CAPSULE | ORAL | 0 refills | Status: DC

## 2019-12-13 NOTE — Progress Notes (Deleted)
Careteam: Patient Care Team: Lauree Chandler, NP as PCP - General (Geriatric Medicine) Everlene Farrier, MD as Consulting Physician (Obstetrics and Gynecology) Rutherford Guys, MD as Consulting Physician (Ophthalmology)  PLACE OF SERVICE:  [place]  Advanced Directive information    Allergies  Allergen Reactions  . Influenza Vaccines Other (See Comments)    Left side of body went numb x several days   . Other     Honeydew melon  . Benzoyl Peroxide Swelling    Chief Complaint  Patient presents with  . Annual Exam    Annual Exam (Physical)     HPI: Patient is a 54 y.o. female ***  Review of Systems:  ROS***  Past Medical History:  Diagnosis Date  . Anemia   . Arthritis   . Back pain   . Cataract   . Constipation   . Food allergy   . Glaucoma   . Hx of adenomatous polyp of colon 09/17/2017  . Hyperlipidemia   . Joint pain   . Obesity   . Swelling of lower extremity   . Vitamin D deficiency    Past Surgical History:  Procedure Laterality Date  . ABDOMINOPLASTY  2009  . CATARACT EXTRACTION    . EYE SURGERY    . TUBAL LIGATION  2007  . VARICOSE VEIN SURGERY     Social History:   reports that she has never smoked. She has never used smokeless tobacco. She reports current alcohol use. She reports that she does not use drugs.  Family History  Problem Relation Age of Onset  . Hypertension Mother   . Hyperlipidemia Mother   . Anxiety disorder Mother   . Diabetes Father 21  . Hypertension Father   . Cancer Father   . Prostate cancer Father 34  . Thyroid disease Father   . Colon cancer Neg Hx   . Esophageal cancer Neg Hx   . Liver cancer Neg Hx   . Pancreatic cancer Neg Hx   . Rectal cancer Neg Hx   . Stomach cancer Neg Hx     Medications: Patient's Medications  New Prescriptions   No medications on file  Previous Medications   BRIMONIDINE (ALPHAGAN) 0.15 % OPHTHALMIC SOLUTION    1 drop in right eye twice daily   LATANOPROST (XALATAN) 0.005  % OPHTHALMIC SOLUTION    Place 1 drop into the right eye at bedtime.   MULTIPLE VITAMINS-MINERALS (MULTIVITAMIN WOMEN PO)    Take by mouth.   PRAVASTATIN (PRAVACHOL) 20 MG TABLET    TAKE 1 TABLET BY MOUTH EVERY DAY   VITAMIN D, ERGOCALCIFEROL, (DRISDOL) 1.25 MG (50000 UNIT) CAPS CAPSULE    Take 1 capsule (50,000 Units total) by mouth every 7 (seven) days.  Modified Medications   No medications on file  Discontinued Medications   No medications on file    Physical Exam:  There were no vitals filed for this visit. There is no height or weight on file to calculate BMI. Wt Readings from Last 3 Encounters:  10/11/19 202 lb (91.6 kg)  09/27/19 (!) 205 lb (93 kg)  09/09/19 207 lb (93.9 kg)    Physical Exam***  Labs reviewed: Basic Metabolic Panel: Recent Labs    06/23/19 0858 08/12/19 1636  NA 142 140  K 4.2 4.1  CL 106 102  CO2 30 27  GLUCOSE 82 85  BUN 9 10  CREATININE 0.66 0.68  CALCIUM 9.3 9.4  TSH  --  0.958   Liver  Function Tests: Recent Labs    06/23/19 0858 08/12/19 1636  AST 16 21  ALT 12 12  ALKPHOS  --  57  BILITOT 0.4 0.4  PROT 6.0* 6.2  ALBUMIN  --  4.2   No results for input(s): LIPASE, AMYLASE in the last 8760 hours. No results for input(s): AMMONIA in the last 8760 hours. CBC: Recent Labs    06/23/19 0858 08/12/19 1636  WBC 3.0* 3.2*  NEUTROABS 1,158* 1.4  HGB 12.6 12.5  HCT 39.4 38.0  MCV 87.6 89  PLT 223 245   Lipid Panel: Recent Labs    06/23/19 0858 08/12/19 1636  CHOL 225* 238*  HDL 81 96  LDLCALC 130* 133*  TRIG 58 54  CHOLHDL 2.8  --    TSH: Recent Labs    08/12/19 1636  TSH 0.958   A1C: Lab Results  Component Value Date   HGBA1C 5.4 08/12/2019     Assessment/Plan There are no diagnoses linked to this encounter.  Next appt: *** Elodia Haviland K. Artois, [facility] [phone]

## 2019-12-13 NOTE — Patient Instructions (Signed)

## 2019-12-13 NOTE — Progress Notes (Signed)
Provider: Lauree Chandler, NP  Patient Care Team: Lauree Chandler, NP as PCP - General (Geriatric Medicine) Everlene Farrier, MD as Consulting Physician (Obstetrics and Gynecology) Rutherford Guys, MD as Consulting Physician (Ophthalmology)  Extended Emergency Contact Information Primary Emergency Contact: [name],[name] Address: [address]          [place],  [place] Phone: [phone] Relation: None Allergies  Allergen Reactions  . Influenza Vaccines Other (See Comments)    Left side of body went numb x several days   . Other     Honeydew melon  . Benzoyl Peroxide Swelling   Code Status: FULL Goals of Care: Advanced Directive information Advanced Directives 12/13/2019  Does Patient Have a Medical Advance Directive? No  Would patient like information on creating a medical advance directive? -     Chief Complaint  Patient presents with  . Annual Exam    Annual Exam (Physical)  . Quality Metric Gaps    Hep- C Screening    HPI: Patient is a 54 y.o. female seen in today for an wellness exam at [facility]  Last mammogram Sees GYN for breast exam and PAP, she is due for follow up on this.   She was being follow up at weight and wellness but changed jobs and could not continue with the every 2 week follow up.   Diet? Reports she is eating more protein and less sugar.  Reports she feels like she has control over her binge eating.  She has lost 20 lbs and working to lose.  Working to get to 165 -170 lbs.   Exercise? Walking, was going to gym but due to [place] this is limited.    Dentition:every 6 months  Ophthalmology appt: every 3 months.   Vit d def- on supplement  Hyperlipidemia- fasting today  Depression screen [facility] 2/9 12/13/2019 08/12/2019 06/25/2017 03/18/2015  Decreased Interest 0 0 0 0  Down, Depressed, Hopeless 0 0 0 0  PHQ - 2 Score 0 0 0 0  Altered sleeping - 0 - -  Tired, decreased energy - 1 - -  Change in appetite - 1 - -  Feeling bad or failure  about yourself  - 1 - -  Trouble concentrating - 0 - -  Moving slowly or fidgety/restless - 0 - -  Suicidal thoughts - 0 - -  PHQ-9 Score - 3 - -  Difficult doing work/chores - Not difficult at all - -    Fall Risk  12/13/2019 06/23/2019 04/20/2018 12/30/2017 06/25/2017  Falls in the past year? 0 0 0 No No  Number falls in past yr: 0 0 0 - -  Injury with Fall? 0 0 0 - -   No flowsheet data found.   Health Maintenance  Topic Date Due  . Hepatitis C Screening  Never done  . PAP SMEAR-Modifier  10/02/2020  . MAMMOGRAM  07/12/2021  . COLONOSCOPY  09/11/2024  . TETANUS/TDAP  10/20/2025  . COVID-19 Vaccine  Completed  . INFLUENZA VACCINE  Discontinued  . HIV Screening  Discontinued    Past Medical History:  Diagnosis Date  . Anemia   . Arthritis   . Back pain   . Cataract   . Constipation   . Food allergy   . Glaucoma   . Hx of adenomatous polyp of colon 09/17/2017  . Hyperlipidemia   . Joint pain   . Obesity   . Swelling of lower extremity   . Vitamin D deficiency  Past Surgical History:  Procedure Laterality Date  . ABDOMINOPLASTY  2009  . CATARACT EXTRACTION    . EYE SURGERY    . TUBAL LIGATION  2007  . VARICOSE VEIN SURGERY      Social History   Socioeconomic History  . Marital status: Divorced    Spouse name: Not on file  . Number of children: Not on file  . Years of education: Not on file  . Highest education level: Not on file  Occupational History  . Occupation: Sales executive  Tobacco Use  . Smoking status: Never Smoker  . Smokeless tobacco: Never Used  Vaping Use  . Vaping Use: Never used  Substance and Sexual Activity  . Alcohol use: Yes    Comment: less than one a month   . Drug use: Never  . Sexual activity: Not Currently  Other Topics Concern  . Not on file  Social History Narrative   Social History      Diet?       Do you drink/eat things with caffeine? yes      Marital status?          divorced                           What year were you married? 1991      Do you live in a house, apartment, assisted living, condo, trailer, etc.? condo      Is it one or more stories? Two stories      How many persons live in your home? One       Do you have any pets in your home? (please list) one dog      Highest level of education completed? 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               yes                       Type & how often? Cardio twice per week      Advanced Directives      Do you have a living will? no      Do you have a DNR form?                                  If not, do you want to discuss one? no      Do you have signed POA/HPOA for forms? no      Functional Status      Do you have difficulty bathing or dressing yourself? no      Do you have difficulty preparing food or eating? no      Do you have difficulty managing your medications? no      Do you have difficulty managing your finances? no      Do you have difficulty affording your medications?         Social Determinants of Health   Financial Resource Strain:   . Difficulty of Paying Living Expenses: Not on file  Food Insecurity:   . Worried About Charity fundraiser in the Last Year: Not on file  . Ran Out of Food in the Last Year: Not on file  Transportation Needs:   . Lack of Transportation (Medical): Not on  file  . Lack of Transportation (Non-Medical): Not on file  Physical Activity:   . Days of Exercise per Week: Not on file  . Minutes of Exercise per Session: Not on file  Stress:   . Feeling of Stress : Not on file  Social Connections:   . Frequency of Communication with Friends and Family: Not on file  . Frequency of Social Gatherings with Friends and Family: Not on file  . Attends Religious Services: Not on file  . Active Member of Clubs or Organizations: Not on file  . Attends Archivist Meetings: Not on file  . Marital Status: Not on  file    Family History  Problem Relation Age of Onset  . Hypertension Mother   . Hyperlipidemia Mother   . Anxiety disorder Mother   . Diabetes Father 62  . Hypertension Father   . Cancer Father   . Prostate cancer Father 47  . Thyroid disease Father   . Colon cancer Neg Hx   . Esophageal cancer Neg Hx   . Liver cancer Neg Hx   . Pancreatic cancer Neg Hx   . Rectal cancer Neg Hx   . Stomach cancer Neg Hx     Review of Systems:  Review of Systems  Constitutional: Negative for chills, fever and weight loss.  HENT: Negative for tinnitus.   Respiratory: Negative for cough, sputum production and shortness of breath.   Cardiovascular: Negative for chest pain, palpitations and leg swelling.  Gastrointestinal: Negative for abdominal pain, constipation, diarrhea and heartburn.  Genitourinary: Negative for dysuria, frequency and urgency.  Musculoskeletal: Negative for back pain, falls, joint pain and myalgias.  Skin: Negative.   Neurological: Negative for dizziness and headaches.  Psychiatric/Behavioral: Negative for depression and memory loss. The patient does not have insomnia.      Allergies as of 12/13/2019      Reactions   Influenza Vaccines Other (See Comments)   Left side of body went numb x several days    Other    Honeydew melon   Benzoyl Peroxide Swelling      Medication List       Accurate as of December 13, 2019 10:41 AM. If you have any questions, ask your nurse or doctor.        brimonidine 0.15 % ophthalmic solution Commonly known as: ALPHAGAN 1 drop in right eye twice daily   latanoprost 0.005 % ophthalmic solution Commonly known as: XALATAN Place 1 drop into the right eye at bedtime.   MULTIVITAMIN WOMEN PO Take by mouth.   pravastatin 20 MG tablet Commonly known as: PRAVACHOL TAKE 1 TABLET BY MOUTH EVERY DAY   Vitamin D (Ergocalciferol) 1.25 MG (50000 UNIT) Caps capsule Commonly known as: DRISDOL Take 1 capsule (50,000 Units total) by mouth  every 7 (seven) days.         Physical Exam: Vitals:   12/13/19 1028  BP: 126/70  Pulse: 73  Resp: 20  Temp: 98.1 F (36.7 C)  TempSrc: Temporal  SpO2: 100%  Weight: 199 lb 12.8 oz (90.6 kg)  Height: 5' 6.5" (1.689 m)   Body mass index is 31.77 kg/m. Wt Readings from Last 3 Encounters:  12/13/19 199 lb 12.8 oz (90.6 kg)  10/11/19 202 lb (91.6 kg)  09/27/19 (!) 205 lb (93 kg)    Physical Exam Constitutional:      General: She is not in acute distress.    Appearance: She is well-developed. She is not diaphoretic.  HENT:  Head: Normocephalic and atraumatic.     Mouth/Throat:     Pharynx: No oropharyngeal exudate.  Eyes:     Conjunctiva/sclera: Conjunctivae normal.     Pupils: Pupils are equal, round, and reactive to light.  Cardiovascular:     Rate and Rhythm: Normal rate and regular rhythm.     Heart sounds: Normal heart sounds.  Pulmonary:     Effort: Pulmonary effort is normal.     Breath sounds: Normal breath sounds.  Chest:     Chest wall: No mass, lacerations or tenderness.     Breasts:        Right: Normal.        Left: Normal.  Abdominal:     General: Bowel sounds are normal.     Palpations: Abdomen is soft.  Musculoskeletal:        General: No tenderness.     Cervical back: Normal range of motion and neck supple.  Lymphadenopathy:     Upper Body:     Right upper body: No supraclavicular, axillary or pectoral adenopathy.     Left upper body: No supraclavicular, axillary or pectoral adenopathy.  Skin:    General: Skin is warm and dry.  Neurological:     Mental Status: She is alert and oriented to person, place, and time.  Psychiatric:        Mood and Affect: Mood normal.        Behavior: Behavior normal.     Labs reviewed: Basic Metabolic Panel: Recent Labs    06/23/19 0858 08/12/19 1636  NA 142 140  K 4.2 4.1  CL 106 102  CO2 30 27  GLUCOSE 82 85  BUN 9 10  CREATININE 0.66 0.68  CALCIUM 9.3 9.4  TSH  --  0.958   Liver  Function Tests: Recent Labs    06/23/19 0858 08/12/19 1636  AST 16 21  ALT 12 12  ALKPHOS  --  57  BILITOT 0.4 0.4  PROT 6.0* 6.2  ALBUMIN  --  4.2   No results for input(s): LIPASE, AMYLASE in the last 8760 hours. No results for input(s): AMMONIA in the last 8760 hours. CBC: Recent Labs    06/23/19 0858 08/12/19 1636  WBC 3.0* 3.2*  NEUTROABS 1,158* 1.4  HGB 12.6 12.5  HCT 39.4 38.0  MCV 87.6 89  PLT 223 245   Lipid Panel: Recent Labs    06/23/19 0858 08/12/19 1636  CHOL 225* 238*  HDL 81 96  LDLCALC 130* 133*  TRIG 58 54  CHOLHDL 2.8  --    Lab Results  Component Value Date   HGBA1C 5.4 08/12/2019    Procedures: No results found.  Assessment/Plan 1. Vitamin D deficiency - Vitamin D, Ergocalciferol, (DRISDOL) 1.25 MG (50000 UNIT) CAPS capsule; Take 1 capsule (50,000 Units total) by mouth every 7 (seven) days.  Dispense: 4 capsule; Refill: 0 - Vitamin D, 25-hydroxy  2. Mixed hyperlipidemia -continues on pravastatin with dietary modifications.  - CMP with eGFR([facility]) - Lipid Panel  3. Class 1 obesity due to excess calories without serious comorbidity with body mass index (BMI) of 31.0 to 31.9 in adult -has lost 22 lbs, unable to continue with weight and wellness due to new work schedule but would like to have closer follow up to help be held accountable. Continues to work on increasing her protein and low carb/sugar. Continues with increase in physical activity.   4. Neutropenia, unspecified type ([place]) - CBC with Differential/Platelet  5. Annual physical  exam Done today;   The patient was counseled regarding the appropriate use of alcohol, regular self-examination of the breasts on a monthly basis, prevention of dental and periodontal disease, diet, regular sustained exercise for at least 30 minutes 5 times per week, routine screening interval for mammogram as recommended by the [place] and ACOG, importance of regular PAP smears, smoking  cessation, tobacco use,  and recommended schedule for GI hemoccult testing, colonoscopy, cholesterol, thyroid and diabetes screening  6. Need for hepatitis C screening test - Hepatitis C antibody    Next appt: 3 months, weight management.  Carlos American. [place], [facility] [phone]

## 2019-12-13 NOTE — Addendum Note (Signed)
Addended by: Marisa Cyphers C on: 12/13/2019 11:32 AM   Modules accepted: Orders

## 2019-12-14 LAB — LIPID PANEL
Cholesterol: 255 mg/dL — ABNORMAL HIGH (ref <200)
HDL: 84 mg/dL (ref >=50)
LDL Cholesterol (Calc): 156 mg/dL (calc) — ABNORMAL HIGH
Non-HDL Cholesterol (Calc): 171 mg/dL (calc) — ABNORMAL HIGH (ref <130)
Total CHOL/HDL Ratio: 3 (calc) (ref <5.0)
Triglycerides: 59 mg/dL (ref <150)

## 2019-12-14 LAB — CBC WITH DIFFERENTIAL/PLATELET
Absolute Monocytes: 313 cells/uL (ref 200–950)
Basophils Absolute: 31 cells/uL (ref 0–200)
Basophils Relative: 1 %
Eosinophils Absolute: 71 cells/uL (ref 15–500)
Eosinophils Relative: 2.3 %
HCT: 39.2 % (ref 35.0–45.0)
Hemoglobin: 12.5 g/dL (ref 11.7–15.5)
Lymphs Abs: 1225 cells/uL (ref 850–3900)
MCH: 28.1 pg (ref 27.0–33.0)
MCHC: 31.9 g/dL — ABNORMAL LOW (ref 32.0–36.0)
MCV: 88.1 fL (ref 80.0–100.0)
MPV: 10.4 fL (ref 7.5–12.5)
Monocytes Relative: 10.1 %
Neutro Abs: 1460 cells/uL — ABNORMAL LOW (ref 1500–7800)
Neutrophils Relative %: 47.1 %
Platelets: 266 10*3/uL (ref 140–400)
RBC: 4.45 10*6/uL (ref 3.80–5.10)
RDW: 12.3 % (ref 11.0–15.0)
Total Lymphocyte: 39.5 %
WBC: 3.1 10*3/uL — ABNORMAL LOW (ref 3.8–10.8)

## 2019-12-14 LAB — COMPLETE METABOLIC PANEL WITH GFR
AG Ratio: 1.9 (calc) (ref 1.0–2.5)
ALT: 14 U/L (ref 6–29)
AST: 17 U/L (ref 10–35)
Albumin: 4 g/dL (ref 3.6–5.1)
Alkaline phosphatase (APISO): 49 U/L (ref 37–153)
BUN: 12 mg/dL (ref 7–25)
CO2: 30 mmol/L (ref 20–32)
Calcium: 9.4 mg/dL (ref 8.6–10.4)
Chloride: 103 mmol/L (ref 98–110)
Creat: 0.69 mg/dL (ref 0.50–1.05)
GFR, Est African American: 114 mL/min/{1.73_m2} (ref >=60)
GFR, Est Non African American: 98 mL/min/{1.73_m2} (ref >=60)
Globulin: 2.1 g/dL (calc) (ref 1.9–3.7)
Glucose, Bld: 92 mg/dL (ref 65–99)
Potassium: 4.4 mmol/L (ref 3.5–5.3)
Sodium: 140 mmol/L (ref 135–146)
Total Bilirubin: 0.4 mg/dL (ref 0.2–1.2)
Total Protein: 6.1 g/dL (ref 6.1–8.1)

## 2019-12-14 LAB — VITAMIN D 25 HYDROXY (VIT D DEFICIENCY, FRACTURES): Vit D, 25-Hydroxy: 57 ng/mL (ref 30–100)

## 2019-12-14 LAB — HEPATITIS C ANTIBODY
Hepatitis C Ab: NONREACTIVE
SIGNAL TO CUT-OFF: 0.01 (ref <1.00)

## 2019-12-16 NOTE — Telephone Encounter (Signed)
I called the patient twice to scheduale a CPE visit and got vm both times-TM

## 2019-12-16 NOTE — Telephone Encounter (Signed)
I called the patient and left her a voicemail trying to schedule her a CPE, her last one was done in 2019-TM

## 2019-12-21 DIAGNOSIS — E782 Mixed hyperlipidemia: Secondary | ICD-10-CM

## 2019-12-21 MED ORDER — ATORVASTATIN CALCIUM 20 MG PO TABS: 20.0000 mg | ORAL_TABLET | Freq: Every day | ORAL | 0 refills | Status: DC

## 2019-12-28 ENCOUNTER — Other Ambulatory Visit: Payer: Self-pay

## 2019-12-28 DIAGNOSIS — E782 Mixed hyperlipidemia: Secondary | ICD-10-CM

## 2019-12-28 MED ORDER — ATORVASTATIN CALCIUM 20 MG PO TABS: 20.0000 mg | ORAL_TABLET | Freq: Every day | ORAL | 0 refills | Status: DC

## 2020-01-07 ENCOUNTER — Other Ambulatory Visit: Payer: Self-pay | Admitting: Nurse Practitioner

## 2020-01-07 DIAGNOSIS — E559 Vitamin D deficiency, unspecified: Secondary | ICD-10-CM

## 2020-02-20 ENCOUNTER — Other Ambulatory Visit: Payer: Self-pay | Admitting: Nurse Practitioner

## 2020-03-06 DIAGNOSIS — Z20828 Contact with and (suspected) exposure to other viral communicable diseases: Secondary | ICD-10-CM | POA: Diagnosis not present

## 2020-03-07 DIAGNOSIS — Z20828 Contact with and (suspected) exposure to other viral communicable diseases: Secondary | ICD-10-CM | POA: Diagnosis not present

## 2020-03-28 ENCOUNTER — Other Ambulatory Visit: Payer: BC Managed Care – PPO

## 2020-03-28 ENCOUNTER — Other Ambulatory Visit: Payer: Self-pay

## 2020-03-28 DIAGNOSIS — E782 Mixed hyperlipidemia: Secondary | ICD-10-CM | POA: Diagnosis not present

## 2020-03-29 LAB — LIPID PANEL
Cholesterol: 217 mg/dL — ABNORMAL HIGH (ref <200)
HDL: 94 mg/dL (ref >=50)
LDL Cholesterol (Calc): 110 mg/dL (calc) — ABNORMAL HIGH
Non-HDL Cholesterol (Calc): 123 mg/dL (calc) (ref <130)
Total CHOL/HDL Ratio: 2.3 (calc) (ref <5.0)
Triglycerides: 50 mg/dL (ref <150)

## 2020-03-30 ENCOUNTER — Other Ambulatory Visit: Payer: Self-pay | Admitting: Nurse Practitioner

## 2020-03-30 DIAGNOSIS — E782 Mixed hyperlipidemia: Secondary | ICD-10-CM

## 2020-04-20 DIAGNOSIS — H25012 Cortical age-related cataract, left eye: Secondary | ICD-10-CM | POA: Diagnosis not present

## 2020-04-20 DIAGNOSIS — H5501 Congenital nystagmus: Secondary | ICD-10-CM | POA: Diagnosis not present

## 2020-04-20 DIAGNOSIS — H2512 Age-related nuclear cataract, left eye: Secondary | ICD-10-CM | POA: Diagnosis not present

## 2020-04-20 DIAGNOSIS — H401111 Primary open-angle glaucoma, right eye, mild stage: Secondary | ICD-10-CM | POA: Diagnosis not present

## 2020-04-21 ENCOUNTER — Ambulatory Visit: Payer: BC Managed Care – PPO | Admitting: Nurse Practitioner

## 2020-04-21 ENCOUNTER — Encounter: Payer: Self-pay | Admitting: Nurse Practitioner

## 2020-04-21 ENCOUNTER — Other Ambulatory Visit: Payer: Self-pay

## 2020-04-21 VITALS — BP 120/90 | HR 75 | Temp 98.0°F | Ht 66.5 in | Wt 199.8 lb

## 2020-04-21 DIAGNOSIS — D709 Neutropenia, unspecified: Secondary | ICD-10-CM

## 2020-04-21 DIAGNOSIS — E559 Vitamin D deficiency, unspecified: Secondary | ICD-10-CM | POA: Diagnosis not present

## 2020-04-21 DIAGNOSIS — E782 Mixed hyperlipidemia: Secondary | ICD-10-CM | POA: Diagnosis not present

## 2020-04-21 DIAGNOSIS — Z6831 Body mass index (BMI) 31.0-31.9, adult: Secondary | ICD-10-CM

## 2020-04-21 DIAGNOSIS — E6609 Other obesity due to excess calories: Secondary | ICD-10-CM | POA: Diagnosis not present

## 2020-04-21 NOTE — Progress Notes (Signed)
Careteam: Patient Care Team: Lauree Chandler, NP as PCP - General (Geriatric Medicine) Everlene Farrier, MD as Consulting Physician (Obstetrics and Gynecology) Rutherford Guys, MD as Consulting Physician (Ophthalmology)  PLACE OF SERVICE:  [place] Directive information Does Patient Have a Medical Advance Directive?: No, Would patient like information on creating a medical advance directive?: No - Patient declined  Allergies  Allergen Reactions  . Influenza Vaccines Other (See Comments)    Left side of body went numb x several days   . Other     Honeydew melon  . Benzoyl Peroxide Swelling    Chief Complaint  Patient presents with  . Medical Management of Chronic Issues    Follow up visit.Discuss need for COVID booster and colonoscopy     HPI: Patient is a 55 y.o. female here for routine follow up.  Weight management- was seeing weight and wellness but unable to keep up with rigorous appointments due to work schedule. Confesses she gained some weight over the holidays but trying to get back on track. 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 Manager is going to start offering healthier options in coffee shop. Doing better with binge eating, still struggles on the weekends but tries to keep herself busy.  Diet/Exercise-Unable to go to the gym due to [place]. Does walk throughout the day at her job. Once she feels comfortable going back to the gym she will.  Night sweats- Reports she is going to bed cold and waking up soaking wet and very hot. Went through menopause two years ago. Will start wearing loose fitting clothes.  Vitamin D- stable on vitamin D, hydroxy-25  Hyperlipidemia- started on 20mg  of Lipitor last visit  Review of Systems:  Review of Systems  Constitutional: Negative for chills, fever and weight loss.  HENT: Negative for tinnitus.   Respiratory: Negative for cough, sputum production and shortness of breath.    Cardiovascular: Negative for chest pain, palpitations and leg swelling.  Gastrointestinal: Negative for abdominal pain, constipation, nausea and vomiting.  Genitourinary: Negative for dysuria, frequency and urgency.  Musculoskeletal: Negative for back pain, falls and myalgias.  Skin: Negative.   Neurological: Negative for dizziness, weakness and headaches.  Psychiatric/Behavioral: Negative for depression and memory loss. The patient does not have insomnia.     Past Medical History:  Diagnosis Date  . Anemia   . Arthritis   . Back pain   . Cataract   . Constipation   . Food allergy   . Glaucoma   . Hx of adenomatous polyp of colon 09/17/2017  . Hyperlipidemia   . Joint pain   . Obesity   . Swelling of lower extremity   . Vitamin D deficiency    Past Surgical History:  Procedure Laterality Date  . ABDOMINOPLASTY  2009  . CATARACT EXTRACTION    . EYE SURGERY    . TUBAL LIGATION  2007  . VARICOSE VEIN SURGERY     Social History:   reports that she has never smoked. She has never used smokeless tobacco. She reports current alcohol use. She reports that she does not use drugs.  Family History  Problem Relation Age of Onset  . Hypertension Mother   . Hyperlipidemia Mother   . Anxiety disorder Mother   . Diabetes Father 10  . Hypertension Father   . Cancer Father   . Prostate cancer Father 86  . Thyroid disease Father   . Colon cancer Neg  Hx   . Esophageal cancer Neg Hx   . Liver cancer Neg Hx   . Pancreatic cancer Neg Hx   . Rectal cancer Neg Hx   . Stomach cancer Neg Hx     Medications: Patient's Medications  New Prescriptions   No medications on file  Previous Medications   ATORVASTATIN (LIPITOR) 20 MG TABLET    TAKE 1 TABLET BY MOUTH EVERY DAY   BRIMONIDINE (ALPHAGAN) 0.15 % OPHTHALMIC SOLUTION    1 drop in right eye twice daily   LATANOPROST (XALATAN) 0.005 % OPHTHALMIC SOLUTION    Place 1 drop into the right eye at bedtime.   MULTIPLE VITAMINS-MINERALS  (MULTIVITAMIN WOMEN PO)    Take by mouth.   VITAMIN D, ERGOCALCIFEROL, (DRISDOL) 1.25 MG (50000 UNIT) CAPS CAPSULE    TAKE 1 CAPSULE (50,000 UNITS TOTAL) BY MOUTH EVERY 7 (SEVEN) DAYS  Modified Medications   No medications on file  Discontinued Medications   No medications on file    Physical Exam:  Vitals:   04/21/20 0839  BP: 120/90  Pulse: 75  Temp: 98 F (36.7 C)  TempSrc: Temporal  SpO2: 98%  Weight: 199 lb 12.8 oz (90.6 kg)  Height: 5' 6.5" (1.689 m)   Body mass index is 31.77 kg/m. Wt Readings from Last 3 Encounters:  04/21/20 199 lb 12.8 oz (90.6 kg)  12/13/19 199 lb 12.8 oz (90.6 kg)  10/11/19 202 lb (91.6 kg)    Physical Exam Constitutional:      General: She is not in acute distress.    Appearance: Normal appearance. She is not diaphoretic.  HENT:     Head: Normocephalic and atraumatic.     Mouth/Throat:     Pharynx: No oropharyngeal exudate.  Cardiovascular:     Rate and Rhythm: Normal rate and regular rhythm.     Heart sounds: Normal heart sounds.  Pulmonary:     Effort: Pulmonary effort is normal.     Breath sounds: Normal breath sounds.  Abdominal:     General: Bowel sounds are normal.  Musculoskeletal:        General: No swelling or tenderness. Normal range of motion.  Skin:    General: Skin is warm and dry.  Neurological:     Mental Status: She is alert and oriented to person, place, and time.    Labs reviewed: Basic Metabolic Panel: Recent Labs    06/23/19 0858 08/12/19 1636 12/13/19 1104  NA 142 140 140  K 4.2 4.1 4.4  CL 106 102 103  CO2 30 27 30   GLUCOSE 82 85 92  BUN 9 10 12   CREATININE 0.66 0.68 0.69  CALCIUM 9.3 9.4 9.4  TSH  --  0.958  --    Liver Function Tests: Recent Labs    06/23/19 0858 08/12/19 1636 12/13/19 1104  AST 16 21 17   ALT 12 12 14   ALKPHOS  --  57  --   BILITOT 0.4 0.4 0.4  PROT 6.0* 6.2 6.1  ALBUMIN  --  4.2  --    No results for input(s): LIPASE, AMYLASE in the last 8760 hours. No results  for input(s): AMMONIA in the last 8760 hours. CBC: Recent Labs    06/23/19 0858 08/12/19 1636 12/13/19 1104  WBC 3.0* 3.2* 3.1*  NEUTROABS 1,158* 1.4 1,460*  HGB 12.6 12.5 12.5  HCT 39.4 38.0 39.2  MCV 87.6 89 88.1  PLT 223 245 266   Lipid Panel: Recent Labs    06/23/19 0858 08/12/19  1636 12/13/19 1104 03/28/20 0807  CHOL 225* 238* 255* 217*  HDL 81 96 84 94  LDLCALC 130* 133* 156* 110*  TRIG 58 54 59 50  CHOLHDL 2.8  --  3.0 2.3   TSH: Recent Labs    08/12/19 1636  TSH 0.958   A1C: Lab Results  Component Value Date   HGBA1C 5.4 08/12/2019     Assessment/Plan 1. Vitamin D deficiency Stable on Vitamin D supplement, continue at this time.   2. Mixed hyperlipidemia Stable on lipitor, LDL trending down now at 110 and HDL 94. Will continue with dietary modifications and exercise  3. Class 1 obesity due to excess calories without serious comorbidity with body mass index (BMI) of 31.0 to 31.9 in adult Weight has remained at 200lbs since last visit. Educated on staying busy to prevent binge eating. Will start trying to eat certain foods in moderation. Continue eating foods high in protein and low in sugar/carbs. Continue with physical exercise and strength training.  4. Neutropenia, unspecified type ([place]) Stable on last labs, will follow up next visit   Next appt: 3 months  Maansi Wike K. Ke[place], [facility] [phone]   I personally was present during the history, physical exam and medical decision-making activities of this service and have verified that the service and findings are accurately documented in the student's note

## 2020-07-09 ENCOUNTER — Other Ambulatory Visit: Payer: Self-pay | Admitting: Nurse Practitioner

## 2020-07-09 DIAGNOSIS — E782 Mixed hyperlipidemia: Secondary | ICD-10-CM

## 2020-07-19 ENCOUNTER — Other Ambulatory Visit: Payer: Self-pay

## 2020-07-19 ENCOUNTER — Encounter: Payer: Self-pay | Admitting: Nurse Practitioner

## 2020-07-19 ENCOUNTER — Ambulatory Visit: Payer: BC Managed Care – PPO | Admitting: Nurse Practitioner

## 2020-07-19 VITALS — BP 139/80 | HR 61 | Temp 98.0°F | Ht 66.5 in | Wt 197.6 lb

## 2020-07-19 DIAGNOSIS — D709 Neutropenia, unspecified: Secondary | ICD-10-CM | POA: Diagnosis not present

## 2020-07-19 DIAGNOSIS — E559 Vitamin D deficiency, unspecified: Secondary | ICD-10-CM

## 2020-07-19 DIAGNOSIS — Z6831 Body mass index (BMI) 31.0-31.9, adult: Secondary | ICD-10-CM

## 2020-07-19 DIAGNOSIS — E6609 Other obesity due to excess calories: Secondary | ICD-10-CM

## 2020-07-19 DIAGNOSIS — E782 Mixed hyperlipidemia: Secondary | ICD-10-CM | POA: Diagnosis not present

## 2020-07-19 NOTE — Progress Notes (Signed)
Careteam: Patient Care Team: Lauree Chandler, NP as PCP - General (Geriatric Medicine) Everlene Farrier, MD as Consulting Physician (Obstetrics and Gynecology) Rutherford Guys, MD as Consulting Physician (Ophthalmology)  PLACE OF SERVICE:  [place] Directive information Does Patient Have a Medical Advance Directive?: No, Would patient like information on creating a medical advance directive?: No - Patient declined  Allergies  Allergen Reactions  . Influenza Vaccines Other (See Comments)    Left side of body went numb x several days   . Other     Honeydew melon  . Benzoyl Peroxide Swelling    Chief Complaint  Patient presents with  . Medical Management of Chronic Issues    3 month follow up Discuss COVID booster (2nd one)     HPI: Patient is a 55 y.o. female for routine follow up.  She is not exercising like she should. Short at work and working all the time.  Eating better.  Not walking in the heat.   Knees are stiff- aware she needs to lose weight.   Vit D- continues on supplement   Hyperlipidemia- continues on Lipitor.    Review of Systems:  Review of Systems  Constitutional: Negative for chills, fever and weight loss.  HENT: Negative for tinnitus.   Respiratory: Negative for cough, sputum production and shortness of breath.   Cardiovascular: Negative for chest pain, palpitations and leg swelling.  Gastrointestinal: Negative for abdominal pain, constipation, diarrhea and heartburn.  Genitourinary: Negative for dysuria, frequency and urgency.  Musculoskeletal: Negative for back pain, falls, joint pain and myalgias.  Skin: Negative.   Neurological: Negative for dizziness and headaches.  Psychiatric/Behavioral: Negative for depression and memory loss. The patient does not have insomnia.     Past Medical History:  Diagnosis Date  . Anemia   . Arthritis   . Back pain   . Cataract   . Constipation   . Food allergy   . Glaucoma   . Hx of  adenomatous polyp of colon 09/17/2017  . Hyperlipidemia   . Joint pain   . Obesity   . Swelling of lower extremity   . Vitamin D deficiency    Past Surgical History:  Procedure Laterality Date  . ABDOMINOPLASTY  2009  . CATARACT EXTRACTION    . EYE SURGERY    . TUBAL LIGATION  2007  . VARICOSE VEIN SURGERY     Social History:   reports that she has never smoked. She has never used smokeless tobacco. She reports current alcohol use. She reports that she does not use drugs.  Family History  Problem Relation Age of Onset  . Hypertension Mother   . Hyperlipidemia Mother   . Anxiety disorder Mother   . Diabetes Father 83  . Hypertension Father   . Cancer Father   . Prostate cancer Father 44  . Thyroid disease Father   . Colon cancer Neg Hx   . Esophageal cancer Neg Hx   . Liver cancer Neg Hx   . Pancreatic cancer Neg Hx   . Rectal cancer Neg Hx   . Stomach cancer Neg Hx     Medications: Patient's Medications  New Prescriptions   No medications on file  Previous Medications   ATORVASTATIN (LIPITOR) 20 MG TABLET    TAKE 1 TABLET BY MOUTH EVERY DAY   BRIMONIDINE (ALPHAGAN) 0.15 % OPHTHALMIC SOLUTION    1 drop in right eye twice daily   LATANOPROST (XALATAN) 0.005 % OPHTHALMIC SOLUTION  Place 1 drop into the right eye at bedtime.   MULTIPLE VITAMINS-MINERALS (MULTIVITAMIN WOMEN PO)    Take by mouth.   VITAMIN D, ERGOCALCIFEROL, (DRISDOL) 1.25 MG (50000 UNIT) CAPS CAPSULE    TAKE 1 CAPSULE (50,000 UNITS TOTAL) BY MOUTH EVERY 7 (SEVEN) DAYS  Modified Medications   No medications on file  Discontinued Medications   No medications on file    Physical Exam:  Vitals:   07/19/20 1524  BP: 139/80  Pulse: 61  Temp: 98 F (36.7 C)  TempSrc: Temporal  SpO2: 98%  Weight: 197 lb 9.6 oz (89.6 kg)  Height: 5' 6.5" (1.689 m)   Body mass index is 31.42 kg/m. Wt Readings from Last 3 Encounters:  07/19/20 197 lb 9.6 oz (89.6 kg)  04/21/20 199 lb 12.8 oz (90.6 kg)  12/13/19  199 lb 12.8 oz (90.6 kg)    Physical Exam Constitutional:      General: She is not in acute distress.    Appearance: She is well-developed. She is not diaphoretic.  HENT:     Head: Normocephalic and atraumatic.     Mouth/Throat:     Pharynx: No oropharyngeal exudate.  Eyes:     Conjunctiva/sclera: Conjunctivae normal.     Pupils: Pupils are equal, round, and reactive to light.  Cardiovascular:     Rate and Rhythm: Normal rate and regular rhythm.     Heart sounds: Normal heart sounds.  Pulmonary:     Effort: Pulmonary effort is normal.     Breath sounds: Normal breath sounds.  Abdominal:     General: Bowel sounds are normal.     Palpations: Abdomen is soft.  Musculoskeletal:        General: No tenderness.     Cervical back: Normal range of motion and neck supple.  Skin:    General: Skin is warm and dry.  Neurological:     Mental Status: She is alert and oriented to person, place, and time.     Labs reviewed: Basic Metabolic Panel: Recent Labs    08/12/19 1636 12/13/19 1104  NA 140 140  K 4.1 4.4  CL 102 103  CO2 27 30  GLUCOSE 85 92  BUN 10 12  CREATININE 0.68 0.69  CALCIUM 9.4 9.4  TSH 0.958  --    Liver Function Tests: Recent Labs    08/12/19 1636 12/13/19 1104  AST 21 17  ALT 12 14  ALKPHOS 57  --   BILITOT 0.4 0.4  PROT 6.2 6.1  ALBUMIN 4.2  --    No results for input(s): LIPASE, AMYLASE in the last 8760 hours. No results for input(s): AMMONIA in the last 8760 hours. CBC: Recent Labs    08/12/19 1636 12/13/19 1104  WBC 3.2* 3.1*  NEUTROABS 1.4 1,460*  HGB 12.5 12.5  HCT 38.0 39.2  MCV 89 88.1  PLT 245 266   Lipid Panel: Recent Labs    08/12/19 1636 12/13/19 1104 03/28/20 0807  CHOL 238* 255* 217*  HDL 96 84 94  LDLCALC 133* 156* 110*  TRIG 54 59 50  CHOLHDL  --  3.0 2.3   TSH: Recent Labs    08/12/19 1636  TSH 0.958   A1C: Lab Results  Component Value Date   HGBA1C 5.4 08/12/2019     Assessment/Plan 1. Vitamin D  deficiency -continues on vit d 50,000 units weekly - Vitamin D, 25-hydroxy  2. Mixed hyperlipidemia -continues on statin, no side effects noted - CMP with eGFR([facility]) -  CBC with Differential/Platelet  3. Class 1 obesity due to excess calories without serious comorbidity with body mass index (BMI) of 31.0 to 31.9 in adult -education provided on healthy weight loss through increase in physical activity and proper nutrition   4. Neutropenia, unspecified type ([place]) - CBC with Differential/Platelet  Next appt: 3 months for weight management.  Carlos American. [place], [facility] [phone]

## 2020-07-20 LAB — CBC WITH DIFFERENTIAL/PLATELET
Absolute Monocytes: 363 cells/uL (ref 200–950)
Basophils Absolute: 30 cells/uL (ref 0–200)
Basophils Relative: 0.8 %
Eosinophils Absolute: 70 cells/uL (ref 15–500)
Eosinophils Relative: 1.9 %
HCT: 37.3 % (ref 35.0–45.0)
Hemoglobin: 11.9 g/dL (ref 11.7–15.5)
Lymphs Abs: 1713 cells/uL (ref 850–3900)
MCH: 28.7 pg (ref 27.0–33.0)
MCHC: 31.9 g/dL — ABNORMAL LOW (ref 32.0–36.0)
MCV: 90.1 fL (ref 80.0–100.0)
MPV: 10 fL (ref 7.5–12.5)
Monocytes Relative: 9.8 %
Neutro Abs: 1524 cells/uL (ref 1500–7800)
Neutrophils Relative %: 41.2 %
Platelets: 227 10*3/uL (ref 140–400)
RBC: 4.14 10*6/uL (ref 3.80–5.10)
RDW: 12.6 % (ref 11.0–15.0)
Total Lymphocyte: 46.3 %
WBC: 3.7 10*3/uL — ABNORMAL LOW (ref 3.8–10.8)

## 2020-07-20 LAB — COMPLETE METABOLIC PANEL WITH GFR
AG Ratio: 2.1 (calc) (ref 1.0–2.5)
ALT: 13 U/L (ref 6–29)
AST: 19 U/L (ref 10–35)
Albumin: 3.9 g/dL (ref 3.6–5.1)
Alkaline phosphatase (APISO): 51 U/L (ref 37–153)
BUN: 14 mg/dL (ref 7–25)
CO2: 29 mmol/L (ref 20–32)
Calcium: 9.1 mg/dL (ref 8.6–10.4)
Chloride: 105 mmol/L (ref 98–110)
Creat: 0.64 mg/dL (ref 0.50–1.05)
GFR, Est African American: 116 mL/min/{1.73_m2} (ref >=60)
GFR, Est Non African American: 100 mL/min/{1.73_m2} (ref >=60)
Globulin: 1.9 g/dL (calc) (ref 1.9–3.7)
Glucose, Bld: 82 mg/dL (ref 65–139)
Potassium: 3.8 mmol/L (ref 3.5–5.3)
Sodium: 142 mmol/L (ref 135–146)
Total Bilirubin: 0.4 mg/dL (ref 0.2–1.2)
Total Protein: 5.8 g/dL — ABNORMAL LOW (ref 6.1–8.1)

## 2020-07-20 LAB — VITAMIN D 25 HYDROXY (VIT D DEFICIENCY, FRACTURES): Vit D, 25-Hydroxy: 83 ng/mL (ref 30–100)

## 2020-10-10 ENCOUNTER — Other Ambulatory Visit: Payer: Self-pay | Admitting: Nurse Practitioner

## 2020-10-10 DIAGNOSIS — E782 Mixed hyperlipidemia: Secondary | ICD-10-CM

## 2021-02-27 ENCOUNTER — Other Ambulatory Visit: Payer: Self-pay | Admitting: Nurse Practitioner

## 2021-02-27 DIAGNOSIS — Z1231 Encounter for screening mammogram for malignant neoplasm of breast: Secondary | ICD-10-CM

## 2021-03-22 ENCOUNTER — Ambulatory Visit: Payer: BC Managed Care – PPO

## 2021-03-22 DIAGNOSIS — H401111 Primary open-angle glaucoma, right eye, mild stage: Secondary | ICD-10-CM | POA: Diagnosis not present

## 2021-03-22 DIAGNOSIS — H2701 Aphakia, right eye: Secondary | ICD-10-CM | POA: Diagnosis not present

## 2021-03-22 DIAGNOSIS — H25012 Cortical age-related cataract, left eye: Secondary | ICD-10-CM | POA: Diagnosis not present

## 2021-03-22 DIAGNOSIS — H2512 Age-related nuclear cataract, left eye: Secondary | ICD-10-CM | POA: Diagnosis not present

## 2021-04-04 ENCOUNTER — Ambulatory Visit: Payer: BC Managed Care – PPO

## 2021-04-13 ENCOUNTER — Ambulatory Visit
Admission: RE | Admit: 2021-04-13 | Discharge: 2021-04-13 | Disposition: A | Discharge status: Home / Self Care | Payer: BC Managed Care – PPO | Source: Ambulatory Visit | Attending: Nurse Practitioner | Admitting: Nurse Practitioner

## 2021-04-13 DIAGNOSIS — Z1231 Encounter for screening mammogram for malignant neoplasm of breast: Secondary | ICD-10-CM | POA: Diagnosis not present

## 2021-08-03 ENCOUNTER — Ambulatory Visit (INDEPENDENT_AMBULATORY_CARE_PROVIDER_SITE_OTHER): Payer: BC Managed Care – PPO | Admitting: Nurse Practitioner

## 2021-08-03 ENCOUNTER — Encounter: Payer: Self-pay | Admitting: Nurse Practitioner

## 2021-08-03 VITALS — BP 126/72 | HR 60 | Temp 97.1°F | Ht 66.0 in | Wt 206.0 lb

## 2021-08-03 DIAGNOSIS — E6609 Other obesity due to excess calories: Secondary | ICD-10-CM

## 2021-08-03 DIAGNOSIS — E559 Vitamin D deficiency, unspecified: Secondary | ICD-10-CM | POA: Diagnosis not present

## 2021-08-03 DIAGNOSIS — Z6831 Body mass index (BMI) 31.0-31.9, adult: Secondary | ICD-10-CM

## 2021-08-03 DIAGNOSIS — Z124 Encounter for screening for malignant neoplasm of cervix: Secondary | ICD-10-CM | POA: Diagnosis not present

## 2021-08-03 DIAGNOSIS — Z0000 Encounter for general adult medical examination without abnormal findings: Secondary | ICD-10-CM

## 2021-08-03 DIAGNOSIS — E782 Mixed hyperlipidemia: Secondary | ICD-10-CM

## 2021-08-03 NOTE — Progress Notes (Signed)
Provider: Lauree Chandler, NP  Patient Care Team: Lauree Chandler, NP as PCP - General (Geriatric Medicine) Everlene Farrier, MD as Consulting Physician (Obstetrics and Gynecology) Rutherford Guys, MD as Consulting Physician (Ophthalmology)  Extended Emergency Contact Information Primary Emergency Contact: [name],[name] Address: [place],  [place] Phone: [phone] Relation: None Allergies  Allergen Reactions   Influenza Vaccines Other (See Comments)    Left side of body went numb x several days    Other     Honeydew melon   Benzoyl Peroxide Swelling   Code Status: full Goals of Care: Advanced Directive information    07/19/2020    3:24 PM  Advanced Directives  Does Patient Have a Medical Advance Directive? No  Would patient like information on creating a medical advance directive? No - Patient declined     Chief Complaint  Patient presents with   Annual Exam    Yearly physical and pap. Discuss need for shingrix or post pone if patient refuses. NCIR verified.     HPI: Patient is a 56 y.o. female seen in today for an wellness exam at [facility]      08/03/2021    1:12 PM 12/13/2019   10:33 AM 08/12/2019    9:27 AM 06/25/2017   10:47 AM 03/18/2015    8:39 AM  Depression screen PHQ 2/9  Decreased Interest 0 0 0 0 0  Down, Depressed, Hopeless 0 0 0 0 0  PHQ - 2 Score 0 0 0 0 0  Altered sleeping   0    Tired, decreased energy   1    Change in appetite   1    Feeling bad or failure about yourself    1    Trouble concentrating   0    Moving slowly or fidgety/restless   0    Suicidal thoughts   0    PHQ-9 Score   3    Difficult doing work/chores   Not difficult at all         12/30/2017    2:46 PM 04/20/2018    8:59 AM 06/23/2019    8:24 AM 12/13/2019   10:33 AM 08/03/2021    1:12 PM  Fall Risk  Falls in the past year? No 0 0 0 0  Was there an injury with Fall?  0 0 0 0  Fall Risk Category Calculator  0 0 0 0  Fall Risk Category  Low  Low Low Low  Patient Fall Risk Level  Low fall risk Low fall risk Low fall risk Low fall risk  Patient at Risk for Falls Due to     No Fall Risks  Fall risk Follow up     Falls evaluation completed       View : No data to display.           Health Maintenance  Topic Date Due   Zoster Vaccines- Shingrix (1 of 2) Never done   PAP SMEAR-Modifier  10/02/2020   MAMMOGRAM  04/14/2023   COLONOSCOPY (Pts 45-90yr Insurance coverage will need to be confirmed)  09/11/2024   TETANUS/TDAP  10/20/2025   COVID-19 Vaccine  Completed   Hepatitis C Screening  Completed   HPV VACCINES  Aged Out   INFLUENZA VACCINE  Discontinued   HIV Screening  Discontinued    Past Medical History:  Diagnosis Date   Anemia    Arthritis    Back  pain    Cataract    Constipation    Food allergy    Glaucoma    Hx of adenomatous polyp of colon 09/17/2017   Hyperlipidemia    Joint pain    Obesity    Swelling of lower extremity    Vitamin D deficiency     Past Surgical History:  Procedure Laterality Date   ABDOMINOPLASTY  2009   CATARACT EXTRACTION     EYE SURGERY     TUBAL LIGATION  2007   VARICOSE VEIN SURGERY      Social History   Socioeconomic History   Marital status: Divorced    Spouse name: Not on file   Number of children: Not on file   Years of education: Not on file   Highest education level: Not on file  Occupational History   Occupation: computer operator/inventory analysis  Tobacco Use   Smoking status: Never   Smokeless tobacco: Never  Vaping Use   Vaping Use: Never used  Substance and Sexual Activity   Alcohol use: Yes    Comment: less than one a month    Drug use: Never   Sexual activity: Not Currently  Other Topics Concern   Not on file  Social History Narrative   Social History      Diet?       Do you drink/eat things with caffeine? yes      Marital status?          divorced                          What year were you married? 1991      Do you live in a  house, apartment, assisted living, condo, trailer, etc.? condo      Is it one or more stories? Two stories      How many persons live in your home? One       Do you have any pets in your home? (please list) one dog      Highest level of education completed? 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               yes                       Type & how often? Cardio twice per week      Advanced Directives      Do you have a living will? no      Do you have a DNR form?                                  If not, do you want to discuss one? no      Do you have signed POA/HPOA for forms? no      Functional Status      Do you have difficulty bathing or dressing yourself? no      Do you have difficulty preparing food or eating? no      Do you have difficulty managing your medications? no      Do you have difficulty managing your finances? no      Do you have difficulty affording your medications?         Social Determinants of Health   Financial Resource Strain:  Not on file  Food Insecurity: Not on file  Transportation Needs: Not on file  Physical Activity: Not on file  Stress: Not on file  Social Connections: Not on file    Family History  Problem Relation Age of Onset   Hypertension Mother    Hyperlipidemia Mother    Anxiety disorder Mother    Diabetes Father 21   Hypertension Father    Cancer Father    Prostate cancer Father 72   Thyroid disease Father    Colon cancer Neg Hx    Esophageal cancer Neg Hx    Liver cancer Neg Hx    Pancreatic cancer Neg Hx    Rectal cancer Neg Hx    Stomach cancer Neg Hx     Review of Systems:  Review of Systems  Constitutional:  Negative for chills, fever and weight loss.  HENT:  Negative for tinnitus.   Respiratory:  Negative for cough, sputum production and shortness of breath.   Cardiovascular:  Negative for chest pain, palpitations and leg swelling.  Gastrointestinal:   Negative for abdominal pain, constipation, diarrhea and heartburn.  Genitourinary:  Negative for dysuria, frequency and urgency.  Musculoskeletal:  Negative for back pain, falls, joint pain and myalgias.  Skin: Negative.   Neurological:  Negative for dizziness and headaches.  Psychiatric/Behavioral:  Negative for depression and memory loss. The patient does not have insomnia.     Allergies as of 08/03/2021       Reactions   Influenza Vaccines Other (See Comments)   Left side of body went numb x several days    Other    Honeydew melon   Benzoyl Peroxide Swelling        Medication List        Accurate as of August 03, 2021  1:18 PM. If you have any questions, ask your nurse or doctor.          STOP taking these medications    Vitamin D (Ergocalciferol) 1.25 MG (50000 UNIT) Caps capsule Commonly known as: DRISDOL Stopped by: Lauree Chandler, NP       TAKE these medications    atorvastatin 20 MG tablet Commonly known as: LIPITOR TAKE 1 TABLET BY MOUTH EVERY DAY   brimonidine 0.15 % ophthalmic solution Commonly known as: ALPHAGAN 1 drop in right eye twice daily   latanoprost 0.005 % ophthalmic solution Commonly known as: XALATAN Place 1 drop into the right eye at bedtime.   MULTIVITAMIN WOMEN PO Take by mouth.          Physical Exam: Vitals:   08/03/21 1315  BP: 126/72  Pulse: 60  Temp: (!) 97.1 F (36.2 C)  TempSrc: Temporal  SpO2: 99%  Weight: 206 lb (93.4 kg)  Height: '5\' 6"'  (1.676 m)   Body mass index is 33.25 kg/m. Wt Readings from Last 3 Encounters:  08/03/21 206 lb (93.4 kg)  07/19/20 197 lb 9.6 oz (89.6 kg)  04/21/20 199 lb 12.8 oz (90.6 kg)    Physical Exam Exam conducted with a chaperone present.  Constitutional:      General: She is not in acute distress.    Appearance: She is well-developed. She is not diaphoretic.  HENT:     Head: Normocephalic and atraumatic.     Mouth/Throat:     Pharynx: No oropharyngeal exudate.  Eyes:      Conjunctiva/sclera: Conjunctivae normal.     Pupils: Pupils are equal, round, and reactive to light.  Cardiovascular:  Rate and Rhythm: Normal rate and regular rhythm.     Heart sounds: Normal heart sounds.  Pulmonary:     Effort: Pulmonary effort is normal.     Breath sounds: Normal breath sounds.  Abdominal:     General: Bowel sounds are normal.     Palpations: Abdomen is soft.  Genitourinary:    General: Normal vulva.     Vagina: Normal.     Cervix: Normal.     Uterus: Normal.      Adnexa: Right adnexa normal and left adnexa normal.  Musculoskeletal:     Cervical back: Normal range of motion and neck supple.     Right lower leg: No edema.     Left lower leg: No edema.  Skin:    General: Skin is warm and dry.  Neurological:     Mental Status: She is alert.  Psychiatric:        Mood and Affect: Mood normal.    Labs reviewed: Basic Metabolic Panel: No results for input(s): NA, K, CL, CO2, GLUCOSE, BUN, CREATININE, CALCIUM, MG, PHOS, TSH in the last 8760 hours. Liver Function Tests: No results for input(s): AST, ALT, ALKPHOS, BILITOT, PROT, ALBUMIN in the last 8760 hours. No results for input(s): LIPASE, AMYLASE in the last 8760 hours. No results for input(s): AMMONIA in the last 8760 hours. CBC: No results for input(s): WBC, NEUTROABS, HGB, HCT, MCV, PLT in the last 8760 hours. Lipid Panel: No results for input(s): CHOL, HDL, LDLCALC, TRIG, CHOLHDL, LDLDIRECT in the last 8760 hours. Lab Results  Component Value Date   HGBA1C 5.4 08/12/2019    Procedures: No results found.  Assessment/Plan 1. Cervical cancer screening - PAP, Image Guided [[facility]]  2. Annual physical exam -done today -PREVENTIVE COUNSELING:  The patient was counseled regarding the appropriate use of alcohol, regular self-examination of the breasts on a monthly basis, prevention of dental and periodontal disease, diet, regular sustained exercise for at least 30 minutes 5 times per  week, routine screening interval for mammogram as recommended by the [place] and ACOG, importance of regular PAP smears,tobacco use,  and recommended schedule for GI hemoccult testing, colonoscopy, cholesterol, thyroid and diabetes screening. - PAP, Image Guided [[facility]] - Lipid panel - CMP with eGFR([facility]) - CBC with Differential/Platelet  3. Vitamin D deficiency - Vitamin D, 25-hydroxy  4. Mixed hyperlipidemia - Lipid panel - CMP with eGFR([facility])  5. Class 1 obesity due to excess calories without serious comorbidity with body mass index (BMI) of 31.0 to 31.9 in adult --education provided on healthy weight loss through increase in physical activity and proper nutrition    Next appt: 6 months Alexandrina Fiorini K. [place], [facility] [phone]

## 2021-08-04 LAB — COMPLETE METABOLIC PANEL WITH GFR
AG Ratio: 1.6 (calc) (ref 1.0–2.5)
ALT: 11 U/L (ref 6–29)
AST: 14 U/L (ref 10–35)
Albumin: 4.1 g/dL (ref 3.6–5.1)
Alkaline phosphatase (APISO): 50 U/L (ref 37–153)
BUN: 13 mg/dL (ref 7–25)
CO2: 30 mmol/L (ref 20–32)
Calcium: 9.5 mg/dL (ref 8.6–10.4)
Chloride: 102 mmol/L (ref 98–110)
Creat: 0.66 mg/dL (ref 0.50–1.03)
Globulin: 2.5 g/dL (calc) (ref 1.9–3.7)
Glucose, Bld: 79 mg/dL (ref 65–139)
Potassium: 4.2 mmol/L (ref 3.5–5.3)
Sodium: 139 mmol/L (ref 135–146)
Total Bilirubin: 0.5 mg/dL (ref 0.2–1.2)
Total Protein: 6.6 g/dL (ref 6.1–8.1)
eGFR: 103 mL/min/{1.73_m2} (ref >=60)

## 2021-08-04 LAB — CBC WITH DIFFERENTIAL/PLATELET
Absolute Monocytes: 355 cells/uL (ref 200–950)
Basophils Absolute: 19 cells/uL (ref 0–200)
Basophils Relative: 0.6 %
Eosinophils Absolute: 61 cells/uL (ref 15–500)
Eosinophils Relative: 1.9 %
HCT: 39.4 % (ref 35.0–45.0)
Hemoglobin: 12.8 g/dL (ref 11.7–15.5)
Lymphs Abs: 1501 cells/uL (ref 850–3900)
MCH: 28.6 pg (ref 27.0–33.0)
MCHC: 32.5 g/dL (ref 32.0–36.0)
MCV: 88.1 fL (ref 80.0–100.0)
MPV: 10 fL (ref 7.5–12.5)
Monocytes Relative: 11.1 %
Neutro Abs: 1264 cells/uL — ABNORMAL LOW (ref 1500–7800)
Neutrophils Relative %: 39.5 %
Platelets: 238 10*3/uL (ref 140–400)
RBC: 4.47 10*6/uL (ref 3.80–5.10)
RDW: 12.6 % (ref 11.0–15.0)
Total Lymphocyte: 46.9 %
WBC: 3.2 10*3/uL — ABNORMAL LOW (ref 3.8–10.8)

## 2021-08-04 LAB — LIPID PANEL
Cholesterol: 300 mg/dL — ABNORMAL HIGH (ref <200)
HDL: 96 mg/dL (ref >=50)
LDL Cholesterol (Calc): 187 mg/dL (calc) — ABNORMAL HIGH
Non-HDL Cholesterol (Calc): 204 mg/dL (calc) — ABNORMAL HIGH (ref <130)
Total CHOL/HDL Ratio: 3.1 (calc) (ref <5.0)
Triglycerides: 69 mg/dL (ref <150)

## 2021-08-04 LAB — VITAMIN D 25 HYDROXY (VIT D DEFICIENCY, FRACTURES): Vit D, 25-Hydroxy: 30 ng/mL (ref 30–100)

## 2021-08-06 ENCOUNTER — Other Ambulatory Visit: Payer: Self-pay

## 2021-08-06 DIAGNOSIS — E782 Mixed hyperlipidemia: Secondary | ICD-10-CM

## 2021-08-06 MED ORDER — VITAMIN D3 50 MCG (2000 UT) PO CAPS: 2000.0000 [IU] | ORAL_CAPSULE | Freq: Every day | ORAL | 3 refills | Status: AC

## 2021-08-06 MED ORDER — ATORVASTATIN CALCIUM 40 MG PO TABS: 40.0000 mg | ORAL_TABLET | Freq: Every day | ORAL | 1 refills | Status: DC

## 2021-08-06 NOTE — Addendum Note (Signed)
Addended by: Logan Bores on: 08/06/2021 03:34 PM   Modules accepted: Orders

## 2021-08-07 LAB — PAP IG (IMAGE GUIDED)

## 2021-10-10 ENCOUNTER — Encounter (INDEPENDENT_AMBULATORY_CARE_PROVIDER_SITE_OTHER): Payer: Self-pay

## 2021-11-09 ENCOUNTER — Other Ambulatory Visit: Payer: Self-pay | Admitting: Nurse Practitioner

## 2021-11-09 ENCOUNTER — Other Ambulatory Visit: Payer: BC Managed Care – PPO

## 2021-11-09 DIAGNOSIS — E782 Mixed hyperlipidemia: Secondary | ICD-10-CM | POA: Diagnosis not present

## 2021-11-09 NOTE — Progress Notes (Signed)
This encounter was created in error - please disregard.

## 2021-11-10 LAB — COMPLETE METABOLIC PANEL WITH GFR
AG Ratio: 1.9 (calc) (ref 1.0–2.5)
ALT: 13 U/L (ref 6–29)
AST: 15 U/L (ref 10–35)
Albumin: 4.1 g/dL (ref 3.6–5.1)
Alkaline phosphatase (APISO): 53 U/L (ref 37–153)
BUN: 15 mg/dL (ref 7–25)
CO2: 30 mmol/L (ref 20–32)
Calcium: 9.7 mg/dL (ref 8.6–10.4)
Chloride: 106 mmol/L (ref 98–110)
Creat: 0.69 mg/dL (ref 0.50–1.03)
Globulin: 2.2 g/dL (calc) (ref 1.9–3.7)
Glucose, Bld: 91 mg/dL (ref 65–99)
Potassium: 4.5 mmol/L (ref 3.5–5.3)
Sodium: 143 mmol/L (ref 135–146)
Total Bilirubin: 0.6 mg/dL (ref 0.2–1.2)
Total Protein: 6.3 g/dL (ref 6.1–8.1)
eGFR: 102 mL/min/{1.73_m2} (ref >=60)

## 2021-11-10 LAB — LIPID PANEL
Cholesterol: 207 mg/dL — ABNORMAL HIGH (ref <200)
HDL: 88 mg/dL (ref >=50)
LDL Cholesterol (Calc): 105 mg/dL (calc) — ABNORMAL HIGH
Non-HDL Cholesterol (Calc): 119 mg/dL (calc) (ref <130)
Total CHOL/HDL Ratio: 2.4 (calc) (ref <5.0)
Triglycerides: 54 mg/dL (ref <150)

## 2021-12-15 ENCOUNTER — Encounter (HOSPITAL_COMMUNITY): Payer: Self-pay

## 2021-12-15 ENCOUNTER — Emergency Department (HOSPITAL_COMMUNITY)
Admission: EM | Admit: 2021-12-15 | Discharge: 2021-12-16 | Disposition: A | Discharge status: Home / Self Care | Payer: BC Managed Care – PPO | Attending: Emergency Medicine | Admitting: Emergency Medicine

## 2021-12-15 ENCOUNTER — Emergency Department (HOSPITAL_COMMUNITY): Payer: BC Managed Care – PPO

## 2021-12-15 DIAGNOSIS — W268XXA Contact with other sharp object(s), not elsewhere classified, initial encounter: Secondary | ICD-10-CM | POA: Diagnosis not present

## 2021-12-15 DIAGNOSIS — S68625A Partial traumatic transphalangeal amputation of left ring finger, initial encounter: Secondary | ICD-10-CM | POA: Diagnosis not present

## 2021-12-15 DIAGNOSIS — S68611A Complete traumatic transphalangeal amputation of left index finger, initial encounter: Secondary | ICD-10-CM | POA: Insufficient documentation

## 2021-12-15 DIAGNOSIS — S60945A Unspecified superficial injury of left ring finger, initial encounter: Secondary | ICD-10-CM | POA: Diagnosis not present

## 2021-12-15 DIAGNOSIS — Z23 Encounter for immunization: Secondary | ICD-10-CM | POA: Insufficient documentation

## 2021-12-15 DIAGNOSIS — Y93E8 Activity, other personal hygiene: Secondary | ICD-10-CM | POA: Diagnosis not present

## 2021-12-15 DIAGNOSIS — S68115A Complete traumatic metacarpophalangeal amputation of left ring finger, initial encounter: Secondary | ICD-10-CM | POA: Diagnosis not present

## 2021-12-15 DIAGNOSIS — S68119A Complete traumatic metacarpophalangeal amputation of unspecified finger, initial encounter: Secondary | ICD-10-CM

## 2021-12-15 DIAGNOSIS — I10 Essential (primary) hypertension: Secondary | ICD-10-CM | POA: Diagnosis not present

## 2021-12-15 NOTE — ED Triage Notes (Signed)
Pt comes via [facility] for amputation to L ring fingertip, bleeding controlled, pt was cleaning an ice shaver, pt is legally blind

## 2021-12-15 NOTE — ED Provider Triage Note (Signed)
Emergency Medicine Provider Triage Evaluation Note  Brenda Gonzales , a 56 y.o. female  was evaluated in triage.  Pt complains of finger injury.  She was cleaning an ice shaver and injured her left ring finger.  Review of Systems  Positive: Pain around a 5 Negative: Numbness or tingling  Physical Exam  BP (!) 153/83   Pulse 71   Resp 18   LMP 12/01/2011   SpO2 98%  Gen:   Awake, no distress   Resp:  Normal effort  MSK:   Moves extremities without difficulty  Other:  Full range of motion of the digit.  Able to range the MCP, PIP and DIP.  Appears to have amputated her fingertip and some of her nail.  Nailbed is intact.  Neurovascularly intact  Medical Decision Making  Medically screening exam initiated at 9:22 PM.  Appropriate orders placed.  Brenda Gonzales was informed that the remainder of the evaluation will be completed by another provider, this initial triage assessment does not replace that evaluation, and the importance of remaining in the ED until their evaluation is complete.          Rhae Hammock, PA-C 12/15/21 2124

## 2021-12-16 MED ORDER — OXYCODONE HCL 5 MG PO TABS
5.0000 mg | ORAL_TABLET | Freq: Once | ORAL | Status: AC
  Administered 2021-12-16: 5 mg via ORAL
  Filled 2021-12-16: qty 1

## 2021-12-16 MED ORDER — TETANUS-DIPHTH-ACELL PERTUSSIS 5-2.5-18.5 LF-MCG/0.5 IM SUSY
0.5000 mL | PREFILLED_SYRINGE | Freq: Once | INTRAMUSCULAR | Status: AC
  Administered 2021-12-16: 0.5 mL via INTRAMUSCULAR
  Filled 2021-12-16: qty 0.5

## 2021-12-16 MED ORDER — DOXYCYCLINE HYCLATE 100 MG PO TABS
100.0000 mg | ORAL_TABLET | Freq: Once | ORAL | Status: AC
  Administered 2021-12-16: 100 mg via ORAL
  Filled 2021-12-16: qty 1

## 2021-12-16 MED ORDER — DOXYCYCLINE HYCLATE 100 MG PO CAPS: 100.0000 mg | ORAL_CAPSULE | Freq: Two times a day (BID) | ORAL | 0 refills | Status: DC

## 2021-12-16 NOTE — ED Provider Notes (Signed)
[facility] DEPT Provider Note   CSN: [id number] Arrival date & time: 12/15/21  2111     History  Chief Complaint  Patient presents with   Laceration    Brenda Gonzales is a 56 y.o. female.  With PMH of obesity, HLD who presents with accidental left ring finger distal fingertip amputation after cleaning and ice shaver machine.  She was cleaning her shaved ice machine around 8 PM yesterday when she was drying it and accidentally sliced the end of her fourth finger tip on the machine.  She called EMS who brought her to the ER.  They controlled the bleeding with pressure and dressing to the site.  She is not having any significant pain at the site but notes worse pain when manipulating the area.  She has no loss of sensation numbness or tingling.  She is unsure when her last tetanus was.  She had no fall or other injury resulting from this.   Laceration      Home Medications Prior to Admission medications   Medication Sig Start Date End Date Taking? Authorizing Provider  doxycycline (VIBRAMYCIN) 100 MG capsule Take 1 capsule (100 mg total) by mouth 2 (two) times daily. 12/16/21  Yes Elgie Congo, MD  atorvastatin (LIPITOR) 40 MG tablet Take 1 tablet (40 mg total) by mouth daily. 08/06/21   Lauree Chandler, NP  brimonidine (ALPHAGAN) 0.15 % ophthalmic solution 1 drop in right eye twice daily 06/17/17   [provider]  Cholecalciferol (VITAMIN D3) 50 MCG (2000 UT) capsule Take 1 capsule (2,000 Units total) by mouth daily. 08/06/21   Lauree Chandler, NP  latanoprost (XALATAN) 0.005 % ophthalmic solution Place 1 drop into the right eye at bedtime.    [provider]  Multiple Vitamins-Minerals (MULTIVITAMIN WOMEN PO) Take by mouth.    [provider]      Allergies    Influenza vaccines, Other, and Benzoyl peroxide    Review of Systems   Review of Systems  Physical Exam Updated Vital Signs BP (!) 156/77   Pulse 68    Temp 98.4 F (36.9 C) (Oral)   Resp 17   LMP 12/01/2011   SpO2 100%  Physical Exam Constitutional: Alert and oriented. Well appearing and in no distress. Eyes: Conjunctivae are normal. ENT      Head: Normocephalic and atraumatic. Cardiovascular: S1, S2, regular rate and rhythm with capillary refill less than 2 seconds, well perfused Respiratory: Normal respiratory effort.  O2 sat 100 on RA Gastrointestinal: Soft  Musculoskeletal: Normal range of motion in all extremities. Left fourth finger ring finger distal finger amputation with involvement of distal nail and distal fingertip with no bone exposure, only pulp of finger exposed.  No active hemorrhage.  (See media)Hemostatic.  Full range of motion intact of DIP, PIP, MCP of fourth digit.  Capillary refill less than 2 seconds.  Sensation grossly intact. Neurologic: Normal speech and language. No gross focal neurologic deficits are appreciated. Skin: Skin is warm,. See msk for finger exam and media Psychiatric: Mood and affect are normal. Speech and behavior are normal.      ED Results / Procedures / Treatments   Labs (all labs ordered are listed, but only abnormal results are displayed) Labs Reviewed - No data to display  EKG None  Radiology DG Finger Ring Left  Result Date: 12/15/2021 CLINICAL DATA:  Amputation to left ring finger tip. EXAM: LEFT RING FINGER 2+V COMPARISON:  None Available. FINDINGS: There  is no evidence of fracture or dislocation. There is no evidence of arthropathy or other focal bone abnormality. A soft tissue defect is present at the distal tip of the fourth digit. Evaluation for radiopaque foreign body is limited due to overlying bandage material. IMPRESSION: 1. No acute fracture or dislocation. 2. Soft tissue defect at the tip of the fourth digit. Evaluation for radiopaque foreign body is limited due to overlying bandage material. Electronically Signed   By: Brett Fairy M.D.   On: 12/15/2021 21:43     Procedures .Marland KitchenLaceration Repair  Date/Time: 12/16/2021 9:42 AM  Performed by: Elgie Congo, MD Authorized by: Elgie Congo, MD   Laceration details:    Location: left fourth digit fingertip. Exploration:    Imaging obtained: x-ray     Imaging outcome: foreign body not noted   Treatment:    Irrigation solution:  Sterile saline Post-procedure details:    Dressing: xeroform and pressure dressing.   Procedure completion:  Tolerated well, no immediate complications     Medications Ordered in ED Medications  oxyCODONE (Oxy IR/ROXICODONE) immediate release tablet 5 mg (5 mg Oral Given 12/16/21 0925)  Tdap (BOOSTRIX) injection 0.5 mL (0.5 mLs Intramuscular Given 12/16/21 0924)  doxycycline (VIBRA-TABS) tablet 100 mg (100 mg Oral Given 12/16/21 8466)    ED Course/ Medical Decision Making/ A&P                           Medical Decision Making Brenda Gonzales is a 56 y.o. female.  With PMH of obesity, HLD who presents with accidental left ring finger distal fingertip amputation after cleaning and ice shaver machine.  I personally reviewed patient's finger x-ray of the left fourth digit.  I do not visualize any fracture dislocation or foreign body as they mentioned in impression.  She had a distal fingertip amputation with only pulp exposed.  Finger is neurovascularly intact and has full range of motion at all joint spaces, no concern for tendinous injury.  Updated patient's Tdap. Provided with doxycycline and oxycodone in ED. discussed case with Dr. Doran Durand on-call hand surgeon who is in agreement with plan for semiocclusive sterile dressing after I performed wound cleanout with normal saline and discharged with referral to hand surgery to be followed up likely tomorrow and 10 days doxycycline for infection prevention.  Strict return precaution discussed with patient.  She is in agreement with plan.  All questions answered.  She is safe for discharge at this  time.  Risk Prescription drug management.   Final Clinical Impression(s) / ED Diagnoses Final diagnoses:  Amputation of tip of finger, initial encounter    Rx / DC Orders ED Discharge Orders          Ordered    doxycycline (VIBRAMYCIN) 100 MG capsule  2 times daily        12/16/21 0939              Elgie Congo, MD 12/16/21 [phone]

## 2021-12-16 NOTE — Discharge Instructions (Addendum)
Call Dr Hewitt's office [phone]  tomorrow morning to get your appointment to be seen tomorrow by the hand surgery team.  Your x-ray showed no fracture or retained foreign material.  Keep the wound clean and reapply clean dressing daily as discussed.  Take the antibiotics as prescribed.  You can take Tylenol and ibuprofen as needed for pain.

## 2021-12-31 DIAGNOSIS — S61215A Laceration without foreign body of left ring finger without damage to nail, initial encounter: Secondary | ICD-10-CM | POA: Diagnosis not present

## 2022-02-09 ENCOUNTER — Other Ambulatory Visit: Payer: Self-pay | Admitting: Nurse Practitioner

## 2022-02-09 DIAGNOSIS — E782 Mixed hyperlipidemia: Secondary | ICD-10-CM

## 2022-12-03 HISTORY — PX: CATARACT EXTRACTION: SUR2

## 2022-12-11 ENCOUNTER — Other Ambulatory Visit: Payer: Self-pay | Admitting: Nurse Practitioner

## 2022-12-11 DIAGNOSIS — Z1231 Encounter for screening mammogram for malignant neoplasm of breast: Secondary | ICD-10-CM

## 2023-01-09 ENCOUNTER — Ambulatory Visit
Admission: RE | Admit: 2023-01-09 | Discharge: 2023-01-09 | Disposition: A | Discharge status: Home / Self Care | Payer: 59 | Source: Ambulatory Visit | Attending: Nurse Practitioner | Admitting: Nurse Practitioner

## 2023-01-09 DIAGNOSIS — Z1231 Encounter for screening mammogram for malignant neoplasm of breast: Secondary | ICD-10-CM

## 2023-01-25 ENCOUNTER — Other Ambulatory Visit: Payer: Self-pay | Admitting: Nurse Practitioner

## 2023-01-25 DIAGNOSIS — E782 Mixed hyperlipidemia: Secondary | ICD-10-CM

## 2023-02-14 ENCOUNTER — Encounter: Payer: Self-pay | Admitting: Nurse Practitioner

## 2023-02-14 ENCOUNTER — Ambulatory Visit (INDEPENDENT_AMBULATORY_CARE_PROVIDER_SITE_OTHER): Payer: 59 | Admitting: Nurse Practitioner

## 2023-02-14 VITALS — BP 138/80 | HR 68 | Temp 97.3°F | Ht 66.0 in | Wt 203.0 lb

## 2023-02-14 DIAGNOSIS — E782 Mixed hyperlipidemia: Secondary | ICD-10-CM

## 2023-02-14 DIAGNOSIS — E6609 Other obesity due to excess calories: Secondary | ICD-10-CM

## 2023-02-14 DIAGNOSIS — Z6831 Body mass index (BMI) 31.0-31.9, adult: Secondary | ICD-10-CM

## 2023-02-14 DIAGNOSIS — Z0000 Encounter for general adult medical examination without abnormal findings: Secondary | ICD-10-CM | POA: Diagnosis not present

## 2023-02-14 DIAGNOSIS — E559 Vitamin D deficiency, unspecified: Secondary | ICD-10-CM | POA: Diagnosis not present

## 2023-02-14 DIAGNOSIS — E66811 Obesity, class 1: Secondary | ICD-10-CM | POA: Diagnosis not present

## 2023-02-14 NOTE — Patient Instructions (Signed)
To get shingles series at your local pharmacy

## 2023-02-14 NOTE — Progress Notes (Signed)
Provider: Sharon Seller, NP  Patient Care Team: Sharon Seller, NP as PCP - General (Geriatric Medicine) Harold Hedge, MD as Consulting Physician (Obstetrics and Gynecology) Jethro Bolus, MD as Consulting Physician (Ophthalmology)  Extended Emergency Contact Information Primary Emergency Contact: [name],[name] Address: [address]          [place],  [address] Home Phone: [phone] Relation: None Allergies  Allergen Reactions   Influenza Vaccines Other (See Comments)    Left side of body went numb x several days    Other     Honeydew melon   Benzoyl Peroxide Swelling   Code Status: full Goals of Care: Advanced Directive information    12/15/2021    9:21 PM  Advanced Directives  Does Patient Have a Medical Advance Directive? No     Chief Complaint  Patient presents with   Annual Exam    Yearly physical. Discuss need for shingrix.     HPI: Patient is a 57 y.o. female seen in today for an wellness exam at [facility] She has changed jobs and works more and has a hard time getting away. Last PAP 2023 Colonoscopy due in 2026 Not able to exercise.   She recently had cataract surgery on her eyes She was always told they could never remove the cataract on her left  And now she can actually see out of her left eye! She is able to see for the first time in her life.  She goes every 3 months for pressure checks.   She got her flu shot in October       02/14/2023    1:07 PM 08/03/2021    1:12 PM 12/13/2019   10:33 AM 08/12/2019    9:27 AM 06/25/2017   10:47 AM  Depression screen PHQ 2/9  Decreased Interest 0 0 0 0 0  Down, Depressed, Hopeless 0 0 0 0 0  PHQ - 2 Score 0 0 0 0 0  Altered sleeping    0   Tired, decreased energy    1   Change in appetite    1   Feeling bad or failure about yourself     1   Trouble concentrating    0   Moving slowly or fidgety/restless    0   Suicidal thoughts    0   PHQ-9 Score    3   Difficult doing work/chores    Not  difficult at all        06/23/2019    8:24 AM 12/13/2019   10:33 AM 08/03/2021    1:12 PM 12/15/2021    9:22 PM 02/14/2023    1:07 PM  Fall Risk  Falls in the past year? 0 0 0  0  Was there an injury with Fall? 0 0 0  0  Fall Risk Category Calculator 0 0 0  0  Fall Risk Category (Retired) Low Low Low    (RETIRED) Patient Fall Risk Level Low fall risk Low fall risk Low fall risk Low fall risk   Patient at Risk for Falls Due to   No Fall Risks  No Fall Risks  Fall risk Follow up   Falls evaluation completed  Falls evaluation completed       No data to display           Health Maintenance  Topic Date Due   Zoster Vaccines- Shingrix (1 of 2) Never done   Cervical Cancer Screening (HPV/Pap Cotest)  08/03/2024   Colonoscopy  09/11/2024   MAMMOGRAM  01/08/2025   DTaP/Tdap/Td (3 - Td or Tdap) 12/17/2031   INFLUENZA VACCINE  Completed   COVID-19 Vaccine  Completed   Hepatitis C Screening  Completed   HPV VACCINES  Aged Out   HIV Screening  Discontinued    Past Medical History:  Diagnosis Date   Anemia    Arthritis    Back pain    Cataract    Constipation    Food allergy    Glaucoma    Hx of adenomatous polyp of colon 09/17/2017   Hyperlipidemia    Joint pain    Obesity    Swelling of lower extremity    Vitamin D deficiency     Past Surgical History:  Procedure Laterality Date   ABDOMINOPLASTY  2009   CATARACT EXTRACTION     CATARACT EXTRACTION Left 12/2022   EYE SURGERY     TUBAL LIGATION  2007   VARICOSE VEIN SURGERY      Social History   Socioeconomic History   Marital status: Divorced    Spouse name: Not on file   Number of children: Not on file   Years of education: Not on file   Highest education level: Not on file  Occupational History   Occupation: computer operator/inventory analysis  Tobacco Use   Smoking status: Never   Smokeless tobacco: Never  Vaping Use   Vaping status: Never Used  Substance and Sexual Activity   Alcohol use: Yes     Comment: less than one a month    Drug use: Never   Sexual activity: Not Currently  Other Topics Concern   Not on file  Social History Narrative   Social History      Diet?       Do you drink/eat things with caffeine? yes      Marital status?          divorced                          What year were you married? 1991      Do you live in a house, apartment, assisted living, condo, trailer, etc.? condo      Is it one or more stories? Two stories      How many persons live in your home? One       Do you have any pets in your home? (please list) one dog      Highest level of education completed? Associates in [employer]      Current or past profession: Purchasing      Do you exercise?               yes                       Type & how often? Cardio twice per week      Advanced Directives      Do you have a living will? no      Do you have a DNR form?                                  If not, do you want to discuss one? no      Do you have signed POA/HPOA for forms? no      Functional Status      Do you have difficulty bathing  or dressing yourself? no      Do you have difficulty preparing food or eating? no      Do you have difficulty managing your medications? no      Do you have difficulty managing your finances? no      Do you have difficulty affording your medications?         Social Drivers of Corporate investment banker Strain: Not on file  Food Insecurity: Not on file  Transportation Needs: Not on file  Physical Activity: Not on file  Stress: Not on file  Social Connections: Not on file    Family History  Problem Relation Age of Onset   Hypertension Mother    Hyperlipidemia Mother    Anxiety disorder Mother    Diabetes Father 85   Hypertension Father    Cancer Father    Prostate cancer Father 30   Thyroid disease Father    Colon cancer Neg Hx    Esophageal cancer Neg Hx    Liver cancer Neg Hx    Pancreatic cancer Neg Hx    Rectal cancer Neg Hx     Stomach cancer Neg Hx     Review of Systems:  Review of Systems  Constitutional:  Negative for chills, fever and weight loss.  HENT:  Negative for tinnitus.   Respiratory:  Negative for cough, sputum production and shortness of breath.   Cardiovascular:  Negative for chest pain, palpitations and leg swelling.  Gastrointestinal:  Negative for abdominal pain, constipation, diarrhea and heartburn.  Genitourinary:  Negative for dysuria, frequency and urgency.  Musculoskeletal:  Negative for back pain, falls, joint pain and myalgias.  Skin: Negative.   Neurological:  Negative for dizziness and headaches.  Psychiatric/Behavioral:  Negative for depression and memory loss. The patient does not have insomnia.     Allergies as of 02/14/2023       Reactions   Influenza Vaccines Other (See Comments)   Left side of body went numb x several days    Other    Honeydew melon   Benzoyl Peroxide Swelling        Medication List        Accurate as of February 14, 2023  1:35 PM. If you have any questions, ask your nurse or doctor.          STOP taking these medications    brimonidine 0.15 % ophthalmic solution Commonly known as: ALPHAGAN Stopped by: Sharon Seller   doxycycline 100 MG capsule Commonly known as: VIBRAMYCIN Stopped by: Sharon Seller       TAKE these medications    atorvastatin 40 MG tablet Commonly known as: LIPITOR TAKE 1 TABLET BY MOUTH EVERY DAY   Combigan 0.2-0.5 % ophthalmic solution Generic drug: brimonidine-timolol Place 1 drop into both eyes every 12 (twelve) hours.   latanoprost 0.005 % ophthalmic solution Commonly known as: XALATAN Place 1 drop into the right eye at bedtime.   MULTIVITAMIN WOMEN PO Take by mouth.   Vitamin D3 50 MCG (2000 UT) capsule Take 1 capsule (2,000 Units total) by mouth daily.          Physical Exam: Vitals:   02/14/23 1308  BP: 138/80  Pulse: 68  Temp: (!) 97.3 F (36.3 C)  TempSrc: Temporal   SpO2: 99%  Weight: 203 lb (92.1 kg)  Height: 5\' 6"  (1.676 m)   Body mass index is 32.77 kg/m. Wt Readings from Last 3 Encounters:  02/14/23 203 lb (92.1 kg)  08/03/21 206 lb (93.4 kg)  07/19/20 197 lb 9.6 oz (89.6 kg)    Physical Exam Constitutional:      General: She is not in acute distress.    Appearance: She is well-developed. She is not diaphoretic.  HENT:     Head: Normocephalic and atraumatic.     Mouth/Throat:     Pharynx: No oropharyngeal exudate.  Eyes:     Conjunctiva/sclera: Conjunctivae normal.     Pupils: Pupils are equal, round, and reactive to light.  Cardiovascular:     Rate and Rhythm: Normal rate and regular rhythm.     Heart sounds: Normal heart sounds.  Pulmonary:     Effort: Pulmonary effort is normal.     Breath sounds: Normal breath sounds.  Abdominal:     General: Bowel sounds are normal.     Palpations: Abdomen is soft.  Musculoskeletal:     Cervical back: Normal range of motion and neck supple.     Right lower leg: No edema.     Left lower leg: No edema.  Skin:    General: Skin is warm and dry.  Neurological:     Mental Status: She is alert.  Psychiatric:        Mood and Affect: Mood normal.     Labs reviewed: Basic Metabolic Panel: No results for input(s): "NA", "K", "CL", "CO2", "GLUCOSE", "BUN", "CREATININE", "CALCIUM", "MG", "PHOS", "TSH" in the last 8760 hours. Liver Function Tests: No results for input(s): "AST", "ALT", "ALKPHOS", "BILITOT", "PROT", "ALBUMIN" in the last 8760 hours. No results for input(s): "LIPASE", "AMYLASE" in the last 8760 hours. No results for input(s): "AMMONIA" in the last 8760 hours. CBC: No results for input(s): "WBC", "NEUTROABS", "HGB", "HCT", "MCV", "PLT" in the last 8760 hours. Lipid Panel: No results for input(s): "CHOL", "HDL", "LDLCALC", "TRIG", "CHOLHDL", "LDLDIRECT" in the last 8760 hours. Lab Results  Component Value Date   HGBA1C 5.4 08/12/2019    Procedures: No results  found.  Assessment/Plan 1. Mixed hyperlipidemia Continue lipitor with dietary modifications - Lipid panel - COMPLETE METABOLIC PANEL WITH GFR - CBC with Differential/Platelet  2. Annual physical exam (Primary) -wellness visit completed today, The patient was counseled regarding the appropriate use of alcohol, regular self-examination of the breasts on a monthly basis, prevention of dental and periodontal disease, diet, regular sustained exercise for at least 30 minutes 5 times per week, routine screening interval for mammogram as recommended by the American Cancer Society and ACOG, importance of regular PAP smears,  and recommended schedule for GI hemoccult testing, colonoscopy, cholesterol, thyroid and diabetes screening. - COMPLETE METABOLIC PANEL WITH GFR - CBC with Differential/Platelet  3. Vitamin D deficiency She is off vit d supplement at this time - Vitamin D, 25-hydroxy  4. Class 1 obesity due to excess calories without serious comorbidity with body mass index (BMI) of 31.0 to 31.9 in adult --education provided on healthy weight loss through increase in physical activity and proper nutrition    Yearly for CPE Brenda Gonzales K. Biagio Borg  [facility] [phone]

## 2023-02-15 LAB — COMPLETE METABOLIC PANEL WITH GFR
AG Ratio: 2 (calc) (ref 1.0–2.5)
ALT: 16 U/L (ref 6–29)
AST: 23 U/L (ref 10–35)
Albumin: 3.9 g/dL (ref 3.6–5.1)
Alkaline phosphatase (APISO): 60 U/L (ref 37–153)
BUN: 15 mg/dL (ref 7–25)
CO2: 33 mmol/L — ABNORMAL HIGH (ref 20–32)
Calcium: 9.4 mg/dL (ref 8.6–10.4)
Chloride: 103 mmol/L (ref 98–110)
Creat: 0.63 mg/dL (ref 0.50–1.03)
Globulin: 2 g/dL (ref 1.9–3.7)
Glucose, Bld: 92 mg/dL (ref 65–99)
Potassium: 4.3 mmol/L (ref 3.5–5.3)
Sodium: 140 mmol/L (ref 135–146)
Total Bilirubin: 0.4 mg/dL (ref 0.2–1.2)
Total Protein: 5.9 g/dL — ABNORMAL LOW (ref 6.1–8.1)
eGFR: 103 mL/min/{1.73_m2} (ref >=60)

## 2023-02-15 LAB — LIPID PANEL
Cholesterol: 203 mg/dL — ABNORMAL HIGH (ref <200)
HDL: 86 mg/dL (ref >=50)
LDL Cholesterol (Calc): 102 mg/dL — ABNORMAL HIGH
Non-HDL Cholesterol (Calc): 117 mg/dL (ref <130)
Total CHOL/HDL Ratio: 2.4 (calc) (ref <5.0)
Triglycerides: 60 mg/dL (ref <150)

## 2023-02-15 LAB — CBC WITH DIFFERENTIAL/PLATELET
Absolute Lymphocytes: 1465 {cells}/uL (ref 850–3900)
Absolute Monocytes: 345 {cells}/uL (ref 200–950)
Basophils Absolute: 29 {cells}/uL (ref 0–200)
Basophils Relative: 1 %
Eosinophils Absolute: 99 {cells}/uL (ref 15–500)
Eosinophils Relative: 3.4 %
HCT: 36.2 % (ref 35.0–45.0)
Hemoglobin: 11.8 g/dL (ref 11.7–15.5)
MCH: 28.9 pg (ref 27.0–33.0)
MCHC: 32.6 g/dL (ref 32.0–36.0)
MCV: 88.5 fL (ref 80.0–100.0)
MPV: 10.8 fL (ref 7.5–12.5)
Monocytes Relative: 11.9 %
Neutro Abs: 963 {cells}/uL — ABNORMAL LOW (ref 1500–7800)
Neutrophils Relative %: 33.2 %
Platelets: 233 10*3/uL (ref 140–400)
RBC: 4.09 10*6/uL (ref 3.80–5.10)
RDW: 12.9 % (ref 11.0–15.0)
Total Lymphocyte: 50.5 %
WBC: 2.9 10*3/uL — ABNORMAL LOW (ref 3.8–10.8)

## 2023-02-15 LAB — VITAMIN D 25 HYDROXY (VIT D DEFICIENCY, FRACTURES): Vit D, 25-Hydroxy: 38 ng/mL (ref 30–100)

## 2023-02-20 NOTE — Progress Notes (Signed)
Called and discussed result notes with the patient

## 2023-12-06 ENCOUNTER — Other Ambulatory Visit: Payer: Self-pay | Admitting: Nurse Practitioner

## 2023-12-06 DIAGNOSIS — E782 Mixed hyperlipidemia: Secondary | ICD-10-CM

## 2024-02-09 ENCOUNTER — Encounter: Payer: Self-pay | Admitting: Nurse Practitioner

## 2024-02-09 ENCOUNTER — Other Ambulatory Visit: Payer: Self-pay | Admitting: Nurse Practitioner

## 2024-02-09 ENCOUNTER — Ambulatory Visit: Admitting: Nurse Practitioner

## 2024-02-09 VITALS — BP 128/80 | HR 58 | Temp 97.5°F | Ht 66.0 in

## 2024-02-09 DIAGNOSIS — Z1231 Encounter for screening mammogram for malignant neoplasm of breast: Secondary | ICD-10-CM

## 2024-02-09 DIAGNOSIS — N898 Other specified noninflammatory disorders of vagina: Secondary | ICD-10-CM

## 2024-02-09 DIAGNOSIS — E782 Mixed hyperlipidemia: Secondary | ICD-10-CM

## 2024-02-09 DIAGNOSIS — Z124 Encounter for screening for malignant neoplasm of cervix: Secondary | ICD-10-CM

## 2024-02-09 MED ORDER — FLUCONAZOLE 150 MG PO TABS: ORAL_TABLET | ORAL | 0 refills | Status: DC

## 2024-02-09 MED ORDER — ATORVASTATIN CALCIUM 40 MG PO TABS: 40.0000 mg | ORAL_TABLET | Freq: Every day | ORAL | 0 refills | Status: AC

## 2024-02-09 MED ORDER — FLUCONAZOLE 150 MG PO TABS: ORAL_TABLET | ORAL | 0 refills | Status: AC

## 2024-02-09 NOTE — Progress Notes (Signed)
 Careteam: Patient Care Team: Caro Harlene POUR, NP as PCP - General (Geriatric Medicine) Curlene Agent, MD as Consulting Physician (Obstetrics and Gynecology) Roz Anes, MD as Consulting Physician (Ophthalmology)  PLACE OF SERVICE:  [facility]  Advanced Directive information    Allergies  Allergen Reactions   Influenza Vaccines Other (See Comments)    Left side of body went numb x several days    Other     Honeydew melon   Benzoyl Peroxide Swelling    Chief Complaint  Patient presents with   Vaginitis    Patient c/o burning and thick discharge from vaginal area x 1 week. Patient c/o pain during intercourse     HPI:  Discussed the use of AI scribe software for clinical note transcription with the patient, who gave verbal consent to proceed.  History of Present Illness Brenda Gonzales is a 58 year old female who presents with symptoms suggestive of a possible yeast infection.  She experiences pain and a slight odor in the vaginal area, which began one to two days after intercourse. The discharge is thick and clear whitish, without clumpiness, yellow, or green coloration. No itching, fever, or suprapubic pain. There is a burning sensation when urinating, but no increased frequency of urination.    Review of Systems:  Review of Systems  Constitutional:  Negative for chills, fever and weight loss.  Genitourinary:  Negative for dysuria, frequency, hematuria and urgency.    Past Medical History:  Diagnosis Date   Anemia    Arthritis    Back pain    Cataract    Constipation    Food allergy    Glaucoma    Hx of adenomatous polyp of colon 09/17/2017   Hyperlipidemia    Joint pain    Obesity    Swelling of lower extremity    Vitamin D  deficiency    Past Surgical History:  Procedure Laterality Date   ABDOMINOPLASTY  2009   CATARACT EXTRACTION     CATARACT EXTRACTION Left 12/2022   EYE SURGERY     TUBAL LIGATION  2007   VARICOSE VEIN  SURGERY     Social History:   reports that she has never smoked. She has never used smokeless tobacco. She reports current alcohol use. She reports that she does not use drugs.  Family History  Problem Relation Age of Onset   Hypertension Mother    Hyperlipidemia Mother    Anxiety disorder Mother    Dementia Mother    Diabetes Father 75   Hypertension Father    Cancer Father    Prostate cancer Father 54   Thyroid  disease Father    Colon cancer Neg Hx    Esophageal cancer Neg Hx    Liver cancer Neg Hx    Pancreatic cancer Neg Hx    Rectal cancer Neg Hx    Stomach cancer Neg Hx     Medications: Patient's Medications  New Prescriptions   FLUCONAZOLE  (DIFLUCAN ) 150 MG TABLET    Take one tablet now and repeat in 72 hours  Previous Medications   BRIMONIDINE-TIMOLOL (COMBIGAN) 0.2-0.5 % OPHTHALMIC SOLUTION    Place 1 drop into both eyes every 12 (twelve) hours.   CHOLECALCIFEROL (VITAMIN D3) 50 MCG (2000 UT) CAPSULE    Take 1 capsule (2,000 Units total) by mouth daily.   LATANOPROST (XALATAN) 0.005 % OPHTHALMIC SOLUTION    Place 1 drop into the right eye at bedtime.   MULTIPLE VITAMINS-MINERALS (MULTIVITAMIN WOMEN PO)  Take by mouth.  Modified Medications   Modified Medication Previous Medication   ATORVASTATIN  (LIPITOR) 40 MG TABLET atorvastatin  (LIPITOR) 40 MG tablet      Take 1 tablet (40 mg total) by mouth daily.    TAKE 1 TABLET BY MOUTH EVERY DAY  Discontinued Medications   No medications on file    Physical Exam:  Vitals:   02/09/24 1408  BP: 128/80  Pulse: (!) 58  Temp: (!) 97.5 F (36.4 C)  SpO2: 99%  Height: 5' 6 (1.676 m)   Body mass index is 32.77 kg/m. Wt Readings from Last 3 Encounters:  02/14/23 203 lb (92.1 kg)  08/03/21 206 lb (93.4 kg)  07/19/20 197 lb 9.6 oz (89.6 kg)    Physical Exam Exam conducted with a chaperone present.  Genitourinary:    Labia:        Right: No rash.        Left: No rash.      Vagina: Vaginal discharge present.      Cervix: Friability present.     Labs reviewed: Basic Metabolic Panel: Recent Labs    02/14/23 1338  NA 140  K 4.3  CL 103  CO2 33*  GLUCOSE 92  BUN 15  CREATININE 0.63  CALCIUM  9.4   Liver Function Tests: Recent Labs    02/14/23 1338  AST 23  ALT 16  BILITOT 0.4  PROT 5.9*   No results for input(s): LIPASE, AMYLASE in the last 8760 hours. No results for input(s): AMMONIA in the last 8760 hours. CBC: Recent Labs    02/14/23 1338  WBC 2.9*  NEUTROABS 963*  HGB 11.8  HCT 36.2  MCV 88.5  PLT 233   Lipid Panel: Recent Labs    02/14/23 1338  CHOL 203*  HDL 86  LDLCALC 102*  TRIG 60  CHOLHDL 2.4   TSH: No results for input(s): TSH in the last 8760 hours. A1C: Lab Results  Component Value Date   HGBA1C 5.4 08/12/2019     Assessment/Plan  Vaginal discharge -     SureSwab Advanced Bacterial Vaginosis (BV), CT/NG, TMA -     Fungus Culture with Smear -     Fluconazole ; Take one tablet now and repeat in 72 hours  Dispense: 1 tablet; Refill: 0  Mixed hyperlipidemia -     Atorvastatin  Calcium ; Take 1 tablet (40 mg total) by mouth daily.  Dispense: 90 tablet; Refill: 0    Cervical cancer screening - Pap IG w/ reflex to HPV when ASC-U  Keep follow up as scheduled   Conda Wannamaker K. Caro BODILY [facility] [phone]

## 2024-02-10 LAB — SURESWAB® ADVANCED BACTERIAL VAGINOSIS (BV), CT/NG,TMA
C. trachomatis RNA, TMA: NOT DETECTED
N. gonorrhoeae RNA, TMA: NOT DETECTED
SURESWAB(R) ADV BACTERIAL VAGINOSIS(BV),TMA: POSITIVE — AB

## 2024-02-11 ENCOUNTER — Ambulatory Visit: Payer: Self-pay | Admitting: Nurse Practitioner

## 2024-02-11 ENCOUNTER — Telehealth: Payer: Self-pay

## 2024-02-11 MED ORDER — METRONIDAZOLE 500 MG PO TABS: 500.0000 mg | ORAL_TABLET | Freq: Two times a day (BID) | ORAL | 0 refills | Status: AC

## 2024-02-11 NOTE — Telephone Encounter (Signed)
 Spoke with patient, called pharmacy [address] and they are preparing the other pill (diflucan ) for pick up for the patient. It appeared that she was only given 1 pill instead of 2.

## 2024-02-11 NOTE — Telephone Encounter (Signed)
 Copied from CRM #[id number]. Topic: Clinical - Medication Question >> Feb 11, 2024  9:18 AM Diannia H wrote: Reason for CRM: Patient is calling to get some clarification on some medicines that she got at her last visit. Could you assist? Patients callback number is [phone]

## 2024-02-12 ENCOUNTER — Inpatient Hospital Stay
Admission: RE | Admit: 2024-02-12 | Discharge: 2024-02-12 | Attending: Nurse Practitioner | Admitting: Nurse Practitioner

## 2024-02-12 DIAGNOSIS — Z1231 Encounter for screening mammogram for malignant neoplasm of breast: Secondary | ICD-10-CM

## 2024-02-13 LAB — PAP IG W/ RFLX HPV ASCU

## 2024-02-20 ENCOUNTER — Ambulatory Visit: Payer: 59 | Admitting: Nurse Practitioner

## 2024-02-20 ENCOUNTER — Encounter: Payer: Self-pay | Admitting: Nurse Practitioner

## 2024-02-20 VITALS — BP 126/78 | HR 51 | Temp 97.1°F | Ht 66.5 in | Wt 194.2 lb

## 2024-02-20 DIAGNOSIS — Z23 Encounter for immunization: Secondary | ICD-10-CM | POA: Diagnosis not present

## 2024-02-20 DIAGNOSIS — Z0000 Encounter for general adult medical examination without abnormal findings: Secondary | ICD-10-CM | POA: Diagnosis not present

## 2024-02-20 DIAGNOSIS — E782 Mixed hyperlipidemia: Secondary | ICD-10-CM | POA: Diagnosis not present

## 2024-02-20 NOTE — Patient Instructions (Addendum)
 1.) Visit your local pharmacy to receive your covid and shingrix vaccines   2) COLONOSCOPY is due in July 2026

## 2024-02-20 NOTE — Progress Notes (Signed)
 "  Provider: Caro Harlene POUR, NP  Patient Care Team: Caro Harlene POUR, NP as PCP - General (Geriatric Medicine) Curlene Agent, MD as Consulting Physician (Obstetrics and Gynecology) Roz Anes, MD as Consulting Physician (Ophthalmology)  Extended Emergency Contact Information Primary Emergency Contact: [name],[name] Address: [address]          [place] [address] Home Phone: [phone] Relation: None Secondary Emergency Contact: Woods,James Mobile Phone: [phone] Relation: Friend Allergies[1] Code Status: FULL  Goals of Care: Advanced Directive information    12/15/2021    9:21 PM  Advanced Directives  Does Patient Have a Medical Advance Directive? No     Chief Complaint  Patient presents with   Annual Exam    Yearly exam. Discussed need for covid booster (pharmacy), pneumonia vaccine, and shingrix (pharmacy).     HPI: Patient is a 58 y.o. female seen in today for an wellness exam at [facility] Discussed the use of AI scribe software for clinical note transcription with the patient, who gave verbal consent to proceed.  History of Present Illness Brenda Gonzales is a 58 year old female who presents for a preventative exam  Her previous vaginal symptoms, including itching, burning, and discharge, have resolved after completing a course of antibiotics due to BV. No current pain or symptoms are reported.  She has a history of yeast infections characterized by itching and white discharge. She has been provided with a prescription to use if similar symptoms occur in the future.  She has recently undergone a mammogram and Pap smear, both of which are up to date. She engages in aerobic exercise and has lost weight over the past year, going from 203 pounds to 194 pounds.   She works at [employer] and has been there for 26 years. She wakes up early for work, around 4:30 AM, and maintains a consistent sleep schedule even on non-working days.           02/20/2024   12:50 PM 02/14/2023    1:07 PM 08/03/2021    1:12 PM 12/13/2019   10:33 AM 08/12/2019    9:27 AM  Depression screen PHQ 2/9  Decreased Interest 0 0 0 0 0  Down, Depressed, Hopeless 0 0 0 0 0  PHQ - 2 Score 0 0 0 0 0  Altered sleeping     0  Tired, decreased energy     1  Change in appetite     1  Feeling bad or failure about yourself      1  Trouble concentrating     0  Moving slowly or fidgety/restless     0  Suicidal thoughts     0  PHQ-9 Score     3   Difficult doing work/chores     Not difficult at all     Data saved with a previous flowsheet row definition       12/13/2019   10:33 AM 08/03/2021    1:12 PM 12/15/2021    9:22 PM 02/14/2023    1:07 PM 02/20/2024   12:50 PM  Fall Risk  Falls in the past year? 0 0  0 0  Was there an injury with Fall? 0  0   0  0  Fall Risk Category Calculator 0 0  0 0  Fall Risk Category (Retired) Low  Low      (RETIRED) Patient Fall Risk Level Low fall risk  Low fall risk  Low fall risk  Patient at Risk for Falls Due to  No Fall Risks  No Fall Risks No Fall Risks  Fall risk Follow up  Falls evaluation completed   Falls evaluation completed Falls evaluation completed     Data saved with a previous flowsheet row definition       No data to display           Health Maintenance  Topic Date Due   COVID-19 Vaccine (6 - 2025-26 season) 11/01/2024 (Originally 11/03/2023)   Hepatitis B Vaccines 19-59 Average Risk (1 of 3 - 19+ 3-dose series) 02/01/2025 (Originally 12/13/1983)   Zoster Vaccines- Shingrix (1 of 2) 02/01/2025 (Originally 12/13/2014)   Colonoscopy  09/11/2024   Mammogram  02/11/2026   Cervical Cancer Screening (HPV/Pap Cotest)  02/09/2027   DTaP/Tdap/Td (3 - Td or Tdap) 12/17/2031   Pneumococcal Vaccine: 50+ Years  Completed   Influenza Vaccine  Completed   Hepatitis C Screening  Completed   HPV VACCINES  Aged Out   Meningococcal B Vaccine  Aged Out   HIV Screening  Discontinued    Past Medical History:   Diagnosis Date   Anemia    Arthritis    Back pain    Cataract    Constipation    Food allergy    Glaucoma    Hx of adenomatous polyp of colon 09/17/2017   Hyperlipidemia    Joint pain    Obesity    Swelling of lower extremity    Vitamin D  deficiency     Past Surgical History:  Procedure Laterality Date   ABDOMINOPLASTY  2009   CATARACT EXTRACTION     CATARACT EXTRACTION Left 12/2022   EYE SURGERY     TUBAL LIGATION  2007   VARICOSE VEIN SURGERY      Social History   Socioeconomic History   Marital status: Divorced    Spouse name: Not on file   Number of children: Not on file   Years of education: Not on file   Highest education level: Not on file  Occupational History   Occupation: computer operator/inventory analysis  Tobacco Use   Smoking status: Never   Smokeless tobacco: Never  Vaping Use   Vaping status: Never Used  Substance and Sexual Activity   Alcohol use: Yes    Comment: less than one a month    Drug use: Never   Sexual activity: Not Currently  Other Topics Concern   Not on file  Social History Narrative   Social History      Diet?       Do you drink/eat things with caffeine? yes      Marital status?          divorced                          What year were you married? 1991      Do you live in a house, apartment, assisted living, condo, trailer, etc.? condo      Is it one or more stories? Two stories      How many persons live in your home? One       Do you have any pets in your home? (please list) one dog      Highest level of education completed? [employer] in [employer]      Current or past profession: Purchasing      Do you exercise?  yes                       Type & how often? Cardio twice per week      Advanced Directives      Do you have a living will? no      Do you have a DNR form?                                  If not, do you want to discuss one? no      Do you have signed POA/HPOA for forms? no       Functional Status      Do you have difficulty bathing or dressing yourself? no      Do you have difficulty preparing food or eating? no      Do you have difficulty managing your medications? no      Do you have difficulty managing your finances? no      Do you have difficulty affording your medications?         Social Drivers of Health   Tobacco Use: Low Risk (02/20/2024)   Patient History    Smoking Tobacco Use: Never    Smokeless Tobacco Use: Never    Passive Exposure: Not on file  Financial Resource Strain: Not on file  Food Insecurity: Not on file  Transportation Needs: Not on file  Physical Activity: Not on file  Stress: Not on file  Social Connections: Not on file  Depression (PHQ2-9): Low Risk (02/20/2024)   Depression (PHQ2-9)    PHQ-2 Score: 0  Alcohol Screen: Not on file  Housing: Not on file  Utilities: Not on file  Health Literacy: Not on file    Family History  Problem Relation Age of Onset   Hypertension Mother    Hyperlipidemia Mother    Anxiety disorder Mother    Dementia Mother    Diabetes Father 50   Hypertension Father    Cancer Father    Prostate cancer Father 37   Thyroid  disease Father    Colon cancer Neg Hx    Esophageal cancer Neg Hx    Liver cancer Neg Hx    Pancreatic cancer Neg Hx    Rectal cancer Neg Hx    Stomach cancer Neg Hx     Review of Systems:  Review of Systems  Constitutional:  Negative for chills, fever and weight loss.  HENT:  Negative for tinnitus.   Respiratory:  Negative for cough, sputum production and shortness of breath.   Cardiovascular:  Negative for chest pain, palpitations and leg swelling.  Gastrointestinal:  Negative for abdominal pain, constipation, diarrhea and heartburn.  Genitourinary:  Negative for dysuria, frequency and urgency.  Musculoskeletal:  Negative for back pain, falls, joint pain and myalgias.  Skin: Negative.   Neurological:  Negative for dizziness and headaches.   Psychiatric/Behavioral:  Negative for depression and memory loss. The patient does not have insomnia.      Allergies as of 02/20/2024       Reactions   Influenza Vaccines Other (See Comments)   Left side of body went numb x several days    Other    Honeydew melon   Benzoyl Peroxide Swelling        Medication List        Accurate as of February 20, 2024  1:26 PM. If you have any questions, ask  your nurse or doctor.          STOP taking these medications    fluconazole  150 MG tablet Commonly known as: DIFLUCAN  Stopped by: Harlene An, NP       TAKE these medications    atorvastatin  40 MG tablet Commonly known as: LIPITOR Take 1 tablet (40 mg total) by mouth daily.   Combigan 0.2-0.5 % ophthalmic solution Generic drug: brimonidine-timolol Place 1 drop into both eyes every 12 (twelve) hours.   latanoprost 0.005 % ophthalmic solution Commonly known as: XALATAN Place 1 drop into the right eye at bedtime.   MULTIVITAMIN WOMEN PO Take by mouth.   Vitamin D3 50 MCG (2000 UT) capsule Take 1 capsule (2,000 Units total) by mouth daily.          Physical Exam: Vitals:   02/20/24 1254  BP: 126/78  Pulse: (!) 51  Temp: (!) 97.1 F (36.2 C)  SpO2: 99%  Weight: 194 lb 3.2 oz (88.1 kg)  Height: 5' 6.5 (1.689 m)   Body mass index is 30.88 kg/m. Wt Readings from Last 3 Encounters:  02/20/24 194 lb 3.2 oz (88.1 kg)  02/14/23 203 lb (92.1 kg)  08/03/21 206 lb (93.4 kg)    Physical Exam Constitutional:      General: She is not in acute distress.    Appearance: She is well-developed. She is not diaphoretic.  HENT:     Head: Normocephalic and atraumatic.     Mouth/Throat:     Pharynx: No oropharyngeal exudate.  Eyes:     Conjunctiva/sclera: Conjunctivae normal.     Pupils: Pupils are equal, round, and reactive to light.  Cardiovascular:     Rate and Rhythm: Normal rate and regular rhythm.     Heart sounds: Normal heart sounds.  Pulmonary:      Effort: Pulmonary effort is normal.     Breath sounds: Normal breath sounds.  Abdominal:     General: Bowel sounds are normal.     Palpations: Abdomen is soft.  Musculoskeletal:     Cervical back: Normal range of motion and neck supple.     Right lower leg: No edema.     Left lower leg: No edema.  Skin:    General: Skin is warm and dry.  Neurological:     Mental Status: She is alert.  Psychiatric:        Mood and Affect: Mood normal.     Labs reviewed: Basic Metabolic Panel: No results for input(s): NA, K, CL, CO2, GLUCOSE, BUN, CREATININE, CALCIUM , MG, PHOS, TSH in the last 8760 hours. Liver Function Tests: No results for input(s): AST, ALT, ALKPHOS, BILITOT, PROT, ALBUMIN in the last 8760 hours. No results for input(s): LIPASE, AMYLASE in the last 8760 hours. No results for input(s): AMMONIA in the last 8760 hours. CBC: No results for input(s): WBC, NEUTROABS, HGB, HCT, MCV, PLT in the last 8760 hours. Lipid Panel: No results for input(s): CHOL, HDL, LDLCALC, TRIG, CHOLHDL, LDLDIRECT in the last 8760 hours. Lab Results  Component Value Date   HGBA1C 5.4 08/12/2019    Procedures: MM 3D SCREENING MAMMOGRAM BILATERAL BREAST Result Date: 02/18/2024 CLINICAL DATA:  Screening. EXAM: DIGITAL SCREENING BILATERAL MAMMOGRAM WITH TOMOSYNTHESIS AND CAD TECHNIQUE: Bilateral screening digital craniocaudal and mediolateral oblique mammograms were obtained. Bilateral screening digital breast tomosynthesis was performed. The images were evaluated with computer-aided detection. COMPARISON:  Previous exam(s). ACR Breast Density Category b: There are scattered areas of fibroglandular density. FINDINGS: There are no findings suspicious  for malignancy. IMPRESSION: No mammographic evidence of malignancy. A result letter of this screening mammogram will be mailed directly to the patient. RECOMMENDATION: Screening mammogram in one year.  (Code:SM-B-01Y) BI-RADS CATEGORY  1: Negative. Electronically Signed   By: Curtistine Noble   On: 02/18/2024 12:38    Assessment/Plan Assessment and Plan Assessment & Plan Woman's Wellness Visit Routine wellness visit with resolution of previous vaginal symptoms. Mammogram and Pap smear up to date. Hearing good. Weight loss noted. Engages in aerobic exercise. - Administered pneumonia vaccine in left deltoid. - Continue aerobic exercise, consider adding weight training. - Schedule colonoscopy for July 2026. - Maintain regular eye and dental exams.  Mixed hyperlipidemia Managed with atorvastatin . No new symptoms. - Continue atorvastatin . - Ordered labs to recheck cholesterol levels.    Next appt: yearly  Aletheia Tangredi K. Caro BODILY  [facility] [phone]     [1]  Allergies Allergen Reactions   Influenza Vaccines Other (See Comments)    Left side of body went numb x several days    Other     Honeydew melon   Benzoyl Peroxide Swelling   "

## 2024-02-21 LAB — CBC WITH DIFFERENTIAL/PLATELET
Absolute Lymphocytes: 1403 {cells}/uL (ref 850–3900)
Absolute Monocytes: 422 {cells}/uL (ref 200–950)
Basophils Absolute: 30 {cells}/uL (ref 0–200)
Basophils Relative: 0.9 %
Eosinophils Absolute: 69 {cells}/uL (ref 15–500)
Eosinophils Relative: 2.1 %
HCT: 38.2 % (ref 35.9–46.0)
Hemoglobin: 12.2 g/dL (ref 11.7–15.5)
MCH: 28.5 pg (ref 27.0–33.0)
MCHC: 31.9 g/dL (ref 31.6–35.4)
MCV: 89.3 fL (ref 81.4–101.7)
MPV: 10.8 fL (ref 7.5–12.5)
Monocytes Relative: 12.8 %
Neutro Abs: 1376 {cells}/uL — ABNORMAL LOW (ref 1500–7800)
Neutrophils Relative %: 41.7 %
Platelets: 257 Thousand/uL (ref 140–400)
RBC: 4.28 Million/uL (ref 3.80–5.10)
RDW: 12.8 % (ref 11.0–15.0)
Total Lymphocyte: 42.5 %
WBC: 3.3 Thousand/uL — ABNORMAL LOW (ref 3.8–10.8)

## 2024-02-21 LAB — COMPREHENSIVE METABOLIC PANEL WITH GFR
AG Ratio: 2 (calc) (ref 1.0–2.5)
ALT: 15 U/L (ref 6–29)
AST: 20 U/L (ref 10–35)
Albumin: 3.9 g/dL (ref 3.6–5.1)
Alkaline phosphatase (APISO): 50 U/L (ref 37–153)
BUN: 12 mg/dL (ref 7–25)
CO2: 31 mmol/L (ref 20–32)
Calcium: 9.2 mg/dL (ref 8.6–10.4)
Chloride: 104 mmol/L (ref 98–110)
Creat: 0.58 mg/dL (ref 0.50–1.03)
Globulin: 2 g/dL (ref 1.9–3.7)
Glucose, Bld: 89 mg/dL (ref 65–99)
Potassium: 4.2 mmol/L (ref 3.5–5.3)
Sodium: 142 mmol/L (ref 135–146)
Total Bilirubin: 0.5 mg/dL (ref 0.2–1.2)
Total Protein: 5.9 g/dL — ABNORMAL LOW (ref 6.1–8.1)
eGFR: 104 mL/min/1.73m2

## 2024-02-21 LAB — LIPID PANEL
Cholesterol: 192 mg/dL
HDL: 88 mg/dL
LDL Cholesterol (Calc): 92 mg/dL
Non-HDL Cholesterol (Calc): 104 mg/dL
Total CHOL/HDL Ratio: 2.2 (calc)
Triglycerides: 41 mg/dL

## 2024-02-24 ENCOUNTER — Ambulatory Visit: Payer: Self-pay | Admitting: Nurse Practitioner

## 2024-03-08 ENCOUNTER — Encounter: Payer: Self-pay | Admitting: Nurse Practitioner

## 2024-03-08 ENCOUNTER — Ambulatory Visit: Admitting: Nurse Practitioner

## 2024-03-08 ENCOUNTER — Ambulatory Visit: Payer: Self-pay

## 2024-03-08 VITALS — BP 124/78 | HR 68 | Temp 96.6°F | Resp 18 | Ht 66.5 in | Wt 190.8 lb

## 2024-03-08 DIAGNOSIS — B029 Zoster without complications: Secondary | ICD-10-CM | POA: Diagnosis not present

## 2024-03-08 NOTE — Progress Notes (Signed)
 "   Careteam: Patient Care Team: Caro Harlene POUR, NP as PCP - General (Geriatric Medicine) Curlene Agent, MD as Consulting Physician (Obstetrics and Gynecology) Roz Anes, MD as Consulting Physician (Ophthalmology)  PLACE OF SERVICE:  [facility]  Advanced Directive information    Allergies[1]  Chief Complaint  Patient presents with   Rash    red itchy bumps on chest area, left underarm pain. Possible shingles outbreak    HPI:  Discussed the use of AI scribe software for clinical note transcription with the patient, who gave verbal consent to proceed.  History of Present Illness Brenda Gonzales is a 59 year old female who presents with a suspected case of shingles.  Symptoms began shortly after Christmas, approximately a week ago, with the onset of a rash less than fist-sized accompanied by pain in the same area. Initially, she thought she had pulled a muscle due to the intensity of the pain. The rash started as two bumps, which she initially attributed to a possible allergic reaction to a change in perfume or lotion, as she is sensitive to such changes. However, the rash progressed, becoming scaly without oozing, and was accompanied by itching. She applied a heating pad, which did not alleviate the symptoms.  The pain has decreased in intensity over time and is not currently severe. She describes the itching as 'driving me crazy' and notes that it is not constant but occurs occasionally. She has not received the shingles vaccine.  No pain on her back and reports occasional itching. Review of Systems:  Review of Systems  Constitutional:  Negative for chills and fever.  Skin:  Positive for itching and rash.  Neurological:  Positive for tingling.    Past Medical History:  Diagnosis Date   Anemia    Arthritis    Back pain    Cataract    Constipation    Food allergy    Glaucoma    Hx of adenomatous polyp of colon 09/17/2017   Hyperlipidemia    Joint pain    Obesity     Swelling of lower extremity    Vitamin D  deficiency    Past Surgical History:  Procedure Laterality Date   ABDOMINOPLASTY  2009   CATARACT EXTRACTION     CATARACT EXTRACTION Left 12/2022   EYE SURGERY     TUBAL LIGATION  2007   VARICOSE VEIN SURGERY     Social History:   reports that she has never smoked. She has never used smokeless tobacco. She reports current alcohol use. She reports that she does not use drugs.  Family History  Problem Relation Age of Onset   Hypertension Mother    Hyperlipidemia Mother    Anxiety disorder Mother    Dementia Mother    Diabetes Father 22   Hypertension Father    Cancer Father    Prostate cancer Father 47   Thyroid  disease Father    Colon cancer Neg Hx    Esophageal cancer Neg Hx    Liver cancer Neg Hx    Pancreatic cancer Neg Hx    Rectal cancer Neg Hx    Stomach cancer Neg Hx     Medications: Patient's Medications  New Prescriptions   No medications on file  Previous Medications   ATORVASTATIN  (LIPITOR) 40 MG TABLET    Take 1 tablet (40 mg total) by mouth daily.   BRIMONIDINE-TIMOLOL (COMBIGAN) 0.2-0.5 % OPHTHALMIC SOLUTION    Place 1 drop into both eyes every 12 (twelve) hours.  CHOLECALCIFEROL (VITAMIN D3) 50 MCG (2000 UT) CAPSULE    Take 1 capsule (2,000 Units total) by mouth daily.   LATANOPROST (XALATAN) 0.005 % OPHTHALMIC SOLUTION    Place 1 drop into the right eye at bedtime.   MULTIPLE VITAMINS-MINERALS (MULTIVITAMIN WOMEN PO)    Take by mouth.  Modified Medications   No medications on file  Discontinued Medications   No medications on file    Physical Exam:  Vitals:   03/08/24 1458  BP: 124/78  Pulse: 68  Resp: 18  Temp: (!) 96.6 F (35.9 C)  SpO2: 98%  Weight: 190 lb 12.8 oz (86.5 kg)  Height: 5' 6.5 (1.689 m)   Body mass index is 30.33 kg/m. Wt Readings from Last 3 Encounters:  03/08/24 190 lb 12.8 oz (86.5 kg)  02/20/24 194 lb 3.2 oz (88.1 kg)  02/14/23 203 lb (92.1 kg)    Physical  Exam Constitutional:      Appearance: Normal appearance.  Pulmonary:     Effort: Pulmonary effort is normal.  Skin:    Findings: Lesion (cluster of lesions to left chest near sterum- scabbed) present.  Neurological:     Mental Status: She is alert. Mental status is at baseline.  Psychiatric:        Mood and Affect: Mood normal.     Labs reviewed: Basic Metabolic Panel: Recent Labs    02/20/24 1326  NA 142  K 4.2  CL 104  CO2 31  GLUCOSE 89  BUN 12  CREATININE 0.58  CALCIUM  9.2   Liver Function Tests: Recent Labs    02/20/24 1326  AST 20  ALT 15  BILITOT 0.5  PROT 5.9*   No results for input(s): LIPASE, AMYLASE in the last 8760 hours. No results for input(s): AMMONIA in the last 8760 hours. CBC: Recent Labs    02/20/24 1326  WBC 3.3*  NEUTROABS 1,376*  HGB 12.2  HCT 38.2  MCV 89.3  PLT 257   Lipid Panel: Recent Labs    02/20/24 1326  CHOL 192  HDL 88  LDLCALC 92  TRIG 41  CHOLHDL 2.2   TSH: No results for input(s): TSH in the last 8760 hours. A1C: Lab Results  Component Value Date   HGBA1C 5.4 08/12/2019     Assessment/Plan Assessment and Plan Assessment & Plan Herpes zoster (shingles) Mild case with scabbing rash on chest, indicating resolution phase. No antiviral treatment due to outbreak duration. Consideration for shingles vaccine post-healing. - Advised against ointments or creams on rash. - Instructed to avoid scratching to prevent secondary infection. - Recommended Tylenol for mild pain. - Advised to wait until lesions are completely healed before receiving shingles vaccine.   Sherrill Buikema K. Caro BODILY [facility] [phone]     [1]  Allergies Allergen Reactions   Influenza Vaccines Other (See Comments)    Left side of body went numb x several days    Other     Honeydew melon   Benzoyl Peroxide Swelling   "

## 2024-03-08 NOTE — Telephone Encounter (Signed)
 Appointment scheduled with Harlene today at 3:00. Patient notified and agreed.

## 2024-03-08 NOTE — Telephone Encounter (Signed)
 FYI Only or Action Required?: FYI only for provider: UC advised.  Patient was last seen in primary care on 02/20/2024 by Caro Harlene POUR, NP.  Called Nurse Triage reporting Rash.  Symptoms began several weeks ago.  Interventions attempted: Other:  .  Symptoms are: unchanged.  Triage Disposition: See Physician Within 24 Hours  Patient/caregiver understands and will follow disposition?: Yes      Copied from CRM #[id number]. Topic: Clinical - Red Word Triage >> Mar 08, 2024  9:46 AM Mercer PEDLAR wrote: Red Word that prompted transfer to Nurse Triage: red itchy bumps on chest area, left underarm pain. Possible shingles outbreak. Reason for Disposition  [1] Shingles rash AND [2] onset > 72 hours ago (3 days)  Answer Assessment - Initial Assessment Questions States it is a circular section about the size of a mandarin orange. Has not spread at all. She states it just hit her today it could be shingles. Due to provider scheduled, RN advised UC for pt to be evaluated and treated if it is indeed shingles.    1. APPEARANCE of RASH: What does the rash look like?      Red blistery bumps 2. LOCATION: Where is the rash located?      Left chest, breast area, pain under left arm against breast 3. ONSET: When did the rash start?      Christmas day 4. ITCHING: Does the rash itch? If Yes, ask: How bad is the itch?  (Scale 1-10; or mild, moderate, severe)     moderate 5. PAIN: Does the rash hurt? If Yes, ask: How bad is the pain?  (Scale 0-10; or none, mild, moderate, severe)     3 6. OTHER SYMPTOMS: Do you have any other symptoms? (e.g., fever)     denies  Protocols used: Shingles (Zoster)-A-AH

## 2024-03-08 NOTE — Patient Instructions (Signed)
 Shingles  Shingles, or herpes zoster, is an infection. It gives you a skin rash and blisters. These infected areas may hurt a lot. Shingles only happens if: You've had chickenpox. You've been given a shot called a vaccine to protect you from getting chickenpox. Shingles is rare in this case. What are the causes? Shingles is caused by a germ called the varicella-zoster virus. This is the same germ that causes chickenpox. After you're exposed to the germ, it stays in your body but is dormant. This means it isn't active. Shingles happens if the germ becomes active again. This can happen years after you're first exposed to the germ. What increases the risk? You may be more likely to get shingles if: You're older than 60 years of age. You're under a lot of stress. You have a weak immune system. The immune system is your body's defense system. It may be weak if: You have human immunodeficiency virus (HIV). You have acquired immunodeficiency syndrome (AIDS). You have cancer. You take medicines that weaken your immune system. These include organ transplant medicines. What are the signs or symptoms? The first symptoms of shingles may be itching, tingling, or pain. Your skin may feel like it's burning. A few days or weeks later, you'll get a rash. Here's what you can expect: The rash is likely to be on one side of your body. The rash may be shaped like a belt or a band. Over time, it will turn into blisters filled with fluid. The blisters will break open and change into scabs. The scabs will dry up in about 2-3 weeks. You may also have: A fever. Chills. A headache. Nausea. How is this diagnosed? Shingles is diagnosed with a skin exam. A sample called a culture may be taken from one of your blisters and sent to a lab. This will show if you have shingles. How is this treated? The rash may last for several weeks. There's no cure for shingles, but your health care provider may give you medicines.  These medicines may: Help with pain. Help with itching. Help with irritation and swelling. Help you get better sooner. Help to prevent long-term problems. If the rash is on your face, you may need to see an eye doctor or an ear, nose, and throat (ENT) doctor. Follow these instructions at home: Medicines Take your medicines only as told by your provider. Put an anti-itch cream or numbing cream on the rash or blisters as told by your provider. Relieving itching and discomfort  To help with itching: Put cold, wet cloths called cold compresses on the rash or blisters. Take a cool bath. Try adding baking soda or dry oatmeal to the water. Do not bathe in hot water. Use calamine lotion on the rash or blisters. You can get this type of lotion at the store. Blister and rash care Keep your rash covered with a loose bandage. Wear loose clothes that don't rub on your rash. Take care of your rash as told by your provider. Make sure you: Wash your hands with soap and water for at least 20 seconds before and after you change your bandage. If you can't use soap and water, use hand sanitizer. Keep your rash and blisters clean by washing them with mild soap and cool water. Change your bandage. Check your rash every day for signs of infection. Check for: More redness, swelling, or pain. Fluid or blood. Warmth. Pus or a bad smell. Do not scratch your rash. Do not pick at your  blisters. To help you not scratch: Keep your fingernails clean and cut short. Try to wear gloves or mittens when you sleep. General instructions Rest. Wash your hands often with soap and water for at least 20 seconds. If you can't use soap and water, use hand sanitizer. Washing your hands lowers your chance of getting a skin infection. Your infection can cause chickenpox in others. If you have blisters that aren't scabs yet, stay away from: Babies. Pregnant people. Children who have eczema. Older people who have organ  transplants. People who have a long-term, or chronic, illness. Anyone who hasn't had chickenpox before. Anyone who hasn't gotten the chickenpox vaccine. How is this prevented? Vaccines are the best way to prevent you from getting chickenpox or shingles. Talk with your provider about getting these shots. Where to find more information Centers for Disease Control and Prevention (CDC): [employer] Contact a health care provider if: Your pain doesn't get better with medicine. Your pain doesn't get better after the rash heals. You have any signs of infection around the rash. Your rash or blisters get worse. You have a fever or chills. Get help right away if: The rash is on your face or nose. You have pain in your face or by your eye. You lose feeling on one side of your face. You have trouble seeing. You have ear pain or ringing in your ear. This information is not intended to replace advice given to you by your health care provider. Make sure you discuss any questions you have with your health care provider. Document Revised: 11/21/2022 Document Reviewed: 04/05/2022 [employer] Patient Education  2024 [employer].

## 2024-03-08 NOTE — Telephone Encounter (Signed)
 Agree with UC evaluation if not availability at office- needs to be seen today

## 2025-02-21 ENCOUNTER — Encounter: Admitting: Nurse Practitioner
